# Patient Record
Sex: Male | Born: 1945 | Race: White | Hispanic: No | Marital: Married | Smoking: Never smoker
Health system: Southern US, Community
[De-identification: ages and names within clinical notes are randomized; demographics above are authoritative.]

## PROBLEM LIST (undated history)

## (undated) DIAGNOSIS — L570 Actinic keratosis: Secondary | ICD-10-CM

## (undated) DIAGNOSIS — C801 Malignant (primary) neoplasm, unspecified: Secondary | ICD-10-CM

## (undated) DIAGNOSIS — R7303 Prediabetes: Secondary | ICD-10-CM

## (undated) DIAGNOSIS — K219 Gastro-esophageal reflux disease without esophagitis: Secondary | ICD-10-CM

## (undated) DIAGNOSIS — I10 Essential (primary) hypertension: Secondary | ICD-10-CM

## (undated) DIAGNOSIS — I209 Angina pectoris, unspecified: Secondary | ICD-10-CM

## (undated) DIAGNOSIS — Z8489 Family history of other specified conditions: Secondary | ICD-10-CM

## (undated) HISTORY — DX: Actinic keratosis: L57.0

## (undated) HISTORY — PX: COLONOSCOPY: SHX174

## (undated) HISTORY — PX: OTHER SURGICAL HISTORY: SHX169

## (undated) HISTORY — PX: BACK SURGERY: SHX140

---

## 2005-03-28 ENCOUNTER — Ambulatory Visit: Payer: Self-pay | Admitting: Family Medicine

## 2005-06-10 ENCOUNTER — Ambulatory Visit: Payer: Self-pay | Admitting: Gastroenterology

## 2005-07-03 ENCOUNTER — Ambulatory Visit: Payer: Self-pay | Admitting: Gastroenterology

## 2008-01-05 ENCOUNTER — Ambulatory Visit: Payer: Self-pay | Admitting: Family Medicine

## 2008-01-28 ENCOUNTER — Ambulatory Visit: Payer: Self-pay | Admitting: Internal Medicine

## 2008-11-24 ENCOUNTER — Ambulatory Visit: Payer: Self-pay | Admitting: Gastroenterology

## 2009-02-08 ENCOUNTER — Ambulatory Visit: Payer: Self-pay | Admitting: Ophthalmology

## 2012-11-03 ENCOUNTER — Ambulatory Visit: Payer: Self-pay | Admitting: Gastroenterology

## 2014-11-02 DIAGNOSIS — I10 Essential (primary) hypertension: Secondary | ICD-10-CM | POA: Diagnosis present

## 2015-05-03 DIAGNOSIS — E119 Type 2 diabetes mellitus without complications: Secondary | ICD-10-CM

## 2015-09-20 ENCOUNTER — Encounter: Payer: Self-pay | Admitting: *Deleted

## 2015-09-24 NOTE — H&P (Signed)
See scanned H&P

## 2015-09-25 ENCOUNTER — Encounter
Admission: RE | Disposition: A | Discharge status: Home / Self Care | Payer: Self-pay | Source: Ambulatory Visit | Attending: Ophthalmology

## 2015-09-25 ENCOUNTER — Ambulatory Visit: Payer: Medicare Other | Admitting: Certified Registered"

## 2015-09-25 ENCOUNTER — Encounter: Payer: Self-pay | Admitting: *Deleted

## 2015-09-25 ENCOUNTER — Ambulatory Visit
Admission: RE | Admit: 2015-09-25 | Discharge: 2015-09-25 | Disposition: A | Discharge status: Home / Self Care | Payer: Medicare Other | Source: Ambulatory Visit | Attending: Ophthalmology | Admitting: Ophthalmology

## 2015-09-25 DIAGNOSIS — H2512 Age-related nuclear cataract, left eye: Secondary | ICD-10-CM | POA: Diagnosis not present

## 2015-09-25 DIAGNOSIS — E7800 Pure hypercholesterolemia, unspecified: Secondary | ICD-10-CM | POA: Insufficient documentation

## 2015-09-25 DIAGNOSIS — Z7982 Long term (current) use of aspirin: Secondary | ICD-10-CM | POA: Diagnosis not present

## 2015-09-25 DIAGNOSIS — Z85820 Personal history of malignant melanoma of skin: Secondary | ICD-10-CM | POA: Diagnosis not present

## 2015-09-25 DIAGNOSIS — I10 Essential (primary) hypertension: Secondary | ICD-10-CM | POA: Diagnosis not present

## 2015-09-25 DIAGNOSIS — Z79899 Other long term (current) drug therapy: Secondary | ICD-10-CM | POA: Diagnosis not present

## 2015-09-25 HISTORY — PX: CATARACT EXTRACTION W/PHACO: SHX586

## 2015-09-25 HISTORY — DX: Malignant (primary) neoplasm, unspecified: C80.1

## 2015-09-25 HISTORY — DX: Essential (primary) hypertension: I10

## 2015-09-25 SURGERY — PHACOEMULSIFICATION, CATARACT, WITH IOL INSERTION
Anesthesia: Monitor Anesthesia Care | Site: Eye | Laterality: Left | Wound class: Clean

## 2015-09-25 MED ORDER — ALFENTANIL 500 MCG/ML IJ INJ
INJECTION | INTRAMUSCULAR | Status: DC | PRN
  Administered 2015-09-25: 500 ug via INTRAVENOUS

## 2015-09-25 MED ORDER — MIDAZOLAM HCL 2 MG/2ML IJ SOLN
INTRAMUSCULAR | Status: DC | PRN
  Administered 2015-09-25: 1 mg via INTRAVENOUS

## 2015-09-25 MED ORDER — EPINEPHRINE HCL 1 MG/ML IJ SOLN
INTRAMUSCULAR | Status: AC
  Filled 2015-09-25: qty 1

## 2015-09-25 MED ORDER — HYALURONIDASE HUMAN 150 UNIT/ML IJ SOLN
INTRAMUSCULAR | Status: AC
  Filled 2015-09-25: qty 1

## 2015-09-25 MED ORDER — EPINEPHRINE HCL 1 MG/ML IJ SOLN
INTRAOCULAR | Status: DC | PRN
  Administered 2015-09-25: 1 mL via OPHTHALMIC

## 2015-09-25 MED ORDER — TETRACAINE HCL 0.5 % OP SOLN
OPHTHALMIC | Status: AC
  Filled 2015-09-25: qty 2

## 2015-09-25 MED ORDER — NA CHONDROIT SULF-NA HYALURON 40-17 MG/ML IO SOLN
INTRAOCULAR | Status: DC | PRN
  Administered 2015-09-25: 1 mL via INTRAOCULAR

## 2015-09-25 MED ORDER — CYCLOPENTOLATE HCL 2 % OP SOLN
OPHTHALMIC | Status: AC
  Administered 2015-09-25: 1 [drp] via OPHTHALMIC
  Filled 2015-09-25: qty 2

## 2015-09-25 MED ORDER — CYCLOPENTOLATE HCL 2 % OP SOLN
1.0000 [drp] | OPHTHALMIC | Status: AC | PRN
  Administered 2015-09-25 (×4): 1 [drp] via OPHTHALMIC

## 2015-09-25 MED ORDER — LIDOCAINE HCL (PF) 4 % IJ SOLN
INTRAMUSCULAR | Status: DC | PRN
  Administered 2015-09-25: 4 mL via OPHTHALMIC

## 2015-09-25 MED ORDER — LIDOCAINE HCL (PF) 4 % IJ SOLN
INTRAMUSCULAR | Status: AC
  Filled 2015-09-25: qty 10

## 2015-09-25 MED ORDER — BUPIVACAINE HCL (PF) 0.75 % IJ SOLN
INTRAMUSCULAR | Status: AC
  Filled 2015-09-25: qty 10

## 2015-09-25 MED ORDER — TETRACAINE HCL 0.5 % OP SOLN
OPHTHALMIC | Status: DC | PRN
  Administered 2015-09-25: 1 [drp] via OPHTHALMIC

## 2015-09-25 MED ORDER — PHENYLEPHRINE HCL 10 % OP SOLN
OPHTHALMIC | Status: AC
  Administered 2015-09-25: 1 [drp] via OPHTHALMIC
  Filled 2015-09-25: qty 5

## 2015-09-25 MED ORDER — CARBACHOL 0.01 % IO SOLN
INTRAOCULAR | Status: DC | PRN
  Administered 2015-09-25: .5 mL via INTRAOCULAR

## 2015-09-25 MED ORDER — MOXIFLOXACIN HCL 0.5 % OP SOLN
OPHTHALMIC | Status: DC | PRN
  Administered 2015-09-25: 1 [drp] via OPHTHALMIC

## 2015-09-25 MED ORDER — CEFUROXIME OPHTHALMIC INJECTION 1 MG/0.1 ML
INJECTION | OPHTHALMIC | Status: DC | PRN
  Administered 2015-09-25: .1 mL via INTRACAMERAL

## 2015-09-25 MED ORDER — MOXIFLOXACIN HCL 0.5 % OP SOLN
OPHTHALMIC | Status: AC
  Administered 2015-09-25: 1 [drp] via OPHTHALMIC
  Filled 2015-09-25: qty 3

## 2015-09-25 MED ORDER — LIDOCAINE HCL (PF) 4 % IJ SOLN
INTRAMUSCULAR | Status: DC | PRN
  Administered 2015-09-25: .5 mL via OPHTHALMIC

## 2015-09-25 MED ORDER — NA CHONDROIT SULF-NA HYALURON 40-17 MG/ML IO SOLN
INTRAOCULAR | Status: AC
  Filled 2015-09-25: qty 1

## 2015-09-25 MED ORDER — PHENYLEPHRINE HCL 10 % OP SOLN
1.0000 [drp] | OPHTHALMIC | Status: AC | PRN
  Administered 2015-09-25 (×4): 1 [drp] via OPHTHALMIC

## 2015-09-25 MED ORDER — MOXIFLOXACIN HCL 0.5 % OP SOLN
1.0000 [drp] | OPHTHALMIC | Status: AC | PRN
  Administered 2015-09-25 (×3): 1 [drp] via OPHTHALMIC

## 2015-09-25 MED ORDER — CEFUROXIME OPHTHALMIC INJECTION 1 MG/0.1 ML
INJECTION | OPHTHALMIC | Status: AC
  Filled 2015-09-25: qty 0.1

## 2015-09-25 MED ORDER — SODIUM CHLORIDE 0.9 % IV SOLN
INTRAVENOUS | Status: DC
  Administered 2015-09-25: 09:00:00 via INTRAVENOUS

## 2015-09-25 SURGICAL SUPPLY — 30 items
CANNULA ANT/CHMB 27GA (MISCELLANEOUS) ×2 IMPLANT
CORD BIP STRL DISP 12FT (MISCELLANEOUS) ×2 IMPLANT
CUP MEDICINE 2OZ PLAST GRAD ST (MISCELLANEOUS) ×2 IMPLANT
DRAPE XRAY CASSETTE 23X24 (DRAPES) ×2 IMPLANT
ERASER HMR WETFIELD 18G (MISCELLANEOUS) ×2 IMPLANT
GLOVE BIO SURGEON STRL SZ8 (GLOVE) ×2 IMPLANT
GLOVE SURG LX 6.5 MICRO (GLOVE) ×1
GLOVE SURG LX 8.0 MICRO (GLOVE) ×1
GLOVE SURG LX STRL 6.5 MICRO (GLOVE) ×1 IMPLANT
GLOVE SURG LX STRL 8.0 MICRO (GLOVE) ×1 IMPLANT
GOWN STRL REUS W/ TWL LRG LVL3 (GOWN DISPOSABLE) ×1 IMPLANT
GOWN STRL REUS W/ TWL XL LVL3 (GOWN DISPOSABLE) ×1 IMPLANT
GOWN STRL REUS W/TWL LRG LVL3 (GOWN DISPOSABLE) ×1
GOWN STRL REUS W/TWL XL LVL3 (GOWN DISPOSABLE) ×1
LENS IOL ACRSF IQ ULTRA 17.5 (Intraocular Lens) ×1 IMPLANT
LENS IOL ACRYSOF IQ 17.5 (Intraocular Lens) ×2 IMPLANT
PACK CATARACT (MISCELLANEOUS) ×2 IMPLANT
PACK CATARACT DINGLEDEIN LX (MISCELLANEOUS) ×2 IMPLANT
PACK EYE AFTER SURG (MISCELLANEOUS) ×2 IMPLANT
SHLD EYE VISITEC  UNIV (MISCELLANEOUS) ×2 IMPLANT
SOL BSS BAG (MISCELLANEOUS) ×2
SOL PREP PVP 2OZ (MISCELLANEOUS) ×2
SOLUTION BSS BAG (MISCELLANEOUS) ×1 IMPLANT
SOLUTION PREP PVP 2OZ (MISCELLANEOUS) ×1 IMPLANT
SUT SILK 5-0 (SUTURE) ×2 IMPLANT
SYR 3ML LL SCALE MARK (SYRINGE) ×2 IMPLANT
SYR 5ML LL (SYRINGE) ×2 IMPLANT
SYR TB 1ML 27GX1/2 LL (SYRINGE) ×2 IMPLANT
WATER STERILE IRR 1000ML POUR (IV SOLUTION) ×2 IMPLANT
WIPE NON LINTING 3.25X3.25 (MISCELLANEOUS) ×2 IMPLANT

## 2015-09-25 NOTE — Discharge Instructions (Addendum)
See hand out.

## 2015-09-25 NOTE — Anesthesia Preprocedure Evaluation (Signed)
Anesthesia Evaluation  Patient identified by MRN, date of birth, ID band Patient awake    Reviewed: Allergy & Precautions, H&P , NPO status , Patient's Chart, lab work & pertinent test results, reviewed documented beta blocker date and time   History of Anesthesia Complications Negative for: history of anesthetic complications  Airway Mallampati: III  TM Distance: >3 FB Neck ROM: full    Dental no notable dental hx. (+) Caps, Teeth Intact   Pulmonary neg pulmonary ROS,    Pulmonary exam normal breath sounds clear to auscultation       Cardiovascular Exercise Tolerance: Good hypertension, (-) angina(-) CAD, (-) Past MI, (-) Cardiac Stents and (-) CABG Normal cardiovascular exam(-) dysrhythmias (-) Valvular Problems/Murmurs Rhythm:regular Rate:Normal     Neuro/Psych negative neurological ROS  negative psych ROS   GI/Hepatic negative GI ROS, Neg liver ROS,   Endo/Other  negative endocrine ROS  Renal/GU negative Renal ROS  negative genitourinary   Musculoskeletal   Abdominal   Peds  Hematology negative hematology ROS (+)   Anesthesia Other Findings Past Medical History:   Hypertension                                                 Cancer ([place])                                                   Comment:melanoma   Reproductive/Obstetrics negative OB ROS                             Anesthesia Physical Anesthesia Plan  ASA: II  Anesthesia Plan: MAC   Post-op Pain Management:    Induction:   Airway Management Planned:   Additional Equipment:   Intra-op Plan:   Post-operative Plan:   Informed Consent: I have reviewed the patients History and Physical, chart, labs and discussed the procedure including the risks, benefits and alternatives for the proposed anesthesia with the patient or authorized representative who has indicated his/her understanding and acceptance.   Dental Advisory  Given  Plan Discussed with: Anesthesiologist, CRNA and Surgeon  Anesthesia Plan Comments:         Anesthesia Quick Evaluation

## 2015-09-25 NOTE — Op Note (Signed)
Date of Surgery: 09/25/2015 Date of Dictation: 09/25/2015 9:57 AM Pre-operative Diagnosis:  Nuclear Sclerotic Cataract left Eye Post-operative Diagnosis: same Procedure performed: Extra-capsular Cataract Extraction (ECCE) with placement of a posterior chamber intraocular lens (IOL) left Eye IOL:  Implant Name Type Inv. Item Serial No. Manufacturer Lot No. LRB No. Used  LENS IOL ACRYSOF IQ 17.5 - [id number] Intraocular Lens LENS IOL ACRYSOF IQ 17.5 [id number] ALCON   Left 1   Anesthesia: 2% Lidocaine and 4% Marcaine in a 50/50 mixture with 10 unites/ml of Hylenex given as a peribulbar Anesthesiologist: Anesthesiologist: Martha Clan, MD CRNA: Rolla Plate, CRNA Complications: none Estimated Blood Loss: less than 1 ml  Description of procedure:  The patient was given anesthesia and sedation via intravenous access. The patient was then prepped and draped in the usual fashion. A 25-gauge needle was bent for initiating the capsulorhexis. A 5-0 silk suture was placed through the conjunctiva superior and inferiorly to serve as bridle sutures. Hemostasis was obtained at the superior limbus using an eraser cautery. A partial thickness groove was made at the anterior surgical limbus with a 64 Beaver blade and this was dissected anteriorly with an [employer]. The anterior chamber was entered at 10 o'clock with a 1.0 mm paracentesis knife and through the lamellar dissection with a 2.6 mm Alcon keratome. Epi-Shugarcaine 0.5 CC [9 cc BSS Plus (Alcon), 3 cc 4% preservative-free lidocaine (Hospira) and 4 cc 1:1000 preservative-free, bisulfite-free epinephrine] was injected into the anterior chamber via the paracentesis tract. Epi-Shugarcaine 0.5 CC [9 cc BSS Plus (Alcon), 3 cc 4% preservative-free lidocaine (Hospira) and 4 cc 1:1000 preservative-free, bisulfite-free epinephrine] was injected into the anterior chamber via the paracentesis tract. DiscoVisc was injected to replace the aqueous and a  continuous tear curvilinear capsulorhexis was performed using a bent 25-gauge needle.  Balance salt on a syringe was used to perform hydro-dissection and phacoemulsification was carried out using a divide and conquer technique. Procedure(s) with comments: CATARACT EXTRACTION PHACO AND INTRAOCULAR LENS PLACEMENT (IOC) (Left) - [place] 01:28 AP% 25.5 CDE 37.96 fluid pack lot [id number] [facility]. Irrigation/aspiration was used to remove the residual cortex and the capsular bag was inflated with DiscoVisc. The intraocular lens was inserted into the capsular bag using a pre-loaded UltraSert Delivery System. Irrigation/aspiration was used to remove the residual DiscoVisc. The wound was inflated with balanced salt and checked for leaks. None were found. Miostat was injected via the paracentesis track and 0.1 ml of cefuroxime containing 1 mg of drug  was injected via the paracentesis track. The wound was checked for leaks again and none were found.   The bridal sutures were removed and two drops of Vigamox were placed on the eye. An eye shield was placed to protect the eye and the patient was discharged to the recovery area in good condition.   Chelsy Parrales MD

## 2015-09-25 NOTE — Interval H&P Note (Signed)
History and Physical Interval Note:  09/25/2015 8:14 AM  Derek Dawson  has presented today for surgery, with the diagnosis of CATARACT  The various methods of treatment have been discussed with the patient and family. After consideration of risks, benefits and other options for treatment, the patient has consented to  Procedure(s): CATARACT EXTRACTION PHACO AND INTRAOCULAR LENS PLACEMENT ([place]) (Left) as a surgical intervention .  The patient's history has been reviewed, patient examined, no change in status, stable for surgery.  I have reviewed the patient's chart and labs.  Questions were answered to the patient's satisfaction.     Derek Dawson

## 2015-09-25 NOTE — Anesthesia Postprocedure Evaluation (Signed)
Anesthesia Post Note  Patient: Derek Dawson  Procedure(s) Performed: Procedure(s) (LRB): CATARACT EXTRACTION PHACO AND INTRAOCULAR LENS PLACEMENT ([place]) (Left)  Patient location during evaluation: Short Stay Anesthesia Type: MAC Level of consciousness: awake and alert Pain management: satisfactory to patient Vital Signs Assessment: post-procedure vital signs reviewed and stable Respiratory status: spontaneous breathing Cardiovascular status: stable Postop Assessment: no headache Anesthetic complications: no    Last Vitals:  Filed Vitals:   09/25/15 0819 09/25/15 0959  BP: 134/55 123/56  Pulse: 61 70  Temp: 36.8 C 36.7 C  Resp: 16 14    Last Pain: There were no vitals filed for this visit.               Rolla Plate P

## 2015-09-25 NOTE — Transfer of Care (Signed)
Immediate Anesthesia Transfer of Care Note  Patient: AZURE MIRANTE  Procedure(s) Performed: Procedure(s) with comments: CATARACT EXTRACTION PHACO AND INTRAOCULAR LENS PLACEMENT ([place]) (Left) - [place] 01:28 AP% 25.5 CDE 37.96 fluid pack lot [id number] [facility]  Patient Location: Short Stay  Anesthesia Type:MAC  Level of Consciousness: awake and alert   Airway & Oxygen Therapy: Patient Spontanous Breathing  Post-op Assessment: Report given to RN  Post vital signs: Reviewed  Last Vitals:  Filed Vitals:   09/25/15 0819 09/25/15 0959  BP: 134/55 123/56  Pulse: 61 71  Temp: 36.8 C 36.7 C  Resp: 16 14    Complications: No apparent anesthesia complications

## 2015-12-01 DIAGNOSIS — M7502 Adhesive capsulitis of left shoulder: Secondary | ICD-10-CM | POA: Insufficient documentation

## 2015-12-01 DIAGNOSIS — M7522 Bicipital tendinitis, left shoulder: Secondary | ICD-10-CM | POA: Insufficient documentation

## 2016-06-12 ENCOUNTER — Encounter: Payer: Self-pay | Admitting: *Deleted

## 2016-06-13 ENCOUNTER — Encounter
Admission: RE | Disposition: A | Discharge status: Home / Self Care | Payer: Self-pay | Source: Ambulatory Visit | Attending: Gastroenterology

## 2016-06-13 ENCOUNTER — Ambulatory Visit: Payer: Medicare Other | Admitting: Anesthesiology

## 2016-06-13 ENCOUNTER — Ambulatory Visit
Admission: RE | Admit: 2016-06-13 | Discharge: 2016-06-13 | Disposition: A | Discharge status: Home / Self Care | Payer: Medicare Other | Source: Ambulatory Visit | Attending: Gastroenterology | Admitting: Gastroenterology

## 2016-06-13 ENCOUNTER — Encounter: Payer: Self-pay | Admitting: *Deleted

## 2016-06-13 DIAGNOSIS — I10 Essential (primary) hypertension: Secondary | ICD-10-CM | POA: Insufficient documentation

## 2016-06-13 DIAGNOSIS — Z7982 Long term (current) use of aspirin: Secondary | ICD-10-CM | POA: Insufficient documentation

## 2016-06-13 DIAGNOSIS — Z5309 Procedure and treatment not carried out because of other contraindication: Secondary | ICD-10-CM | POA: Diagnosis not present

## 2016-06-13 DIAGNOSIS — Z85820 Personal history of malignant melanoma of skin: Secondary | ICD-10-CM | POA: Insufficient documentation

## 2016-06-13 DIAGNOSIS — Z800 Family history of malignant neoplasm of digestive organs: Secondary | ICD-10-CM | POA: Diagnosis not present

## 2016-06-13 DIAGNOSIS — Z86010 Personal history of colonic polyps: Secondary | ICD-10-CM | POA: Insufficient documentation

## 2016-06-13 HISTORY — PX: COLONOSCOPY WITH PROPOFOL: SHX5780

## 2016-06-13 SURGERY — COLONOSCOPY WITH PROPOFOL
Anesthesia: General

## 2016-06-13 MED ORDER — PROPOFOL 10 MG/ML IV BOLUS
INTRAVENOUS | Status: DC | PRN
  Administered 2016-06-13: 100 mg via INTRAVENOUS

## 2016-06-13 MED ORDER — SODIUM CHLORIDE 0.9 % IV SOLN: INTRAVENOUS | Status: DC

## 2016-06-13 MED ORDER — LIDOCAINE 2% (20 MG/ML) 5 ML SYRINGE
INTRAMUSCULAR | Status: DC | PRN
  Administered 2016-06-13: 40 mg via INTRAVENOUS

## 2016-06-13 MED ORDER — SODIUM CHLORIDE 0.9 % IV SOLN
INTRAVENOUS | Status: DC
  Administered 2016-06-13: 08:00:00 via INTRAVENOUS

## 2016-06-13 MED ORDER — PROPOFOL 500 MG/50ML IV EMUL
INTRAVENOUS | Status: DC | PRN
  Administered 2016-06-13: 160 ug/kg/min via INTRAVENOUS

## 2016-06-13 MED ORDER — MIDAZOLAM HCL 5 MG/5ML IJ SOLN
INTRAMUSCULAR | Status: DC | PRN
  Administered 2016-06-13: 1 mg via INTRAVENOUS

## 2016-06-13 MED ORDER — PHENYLEPHRINE HCL 10 MG/ML IJ SOLN
INTRAMUSCULAR | Status: DC | PRN
  Administered 2016-06-13: 100 ug via INTRAVENOUS

## 2016-06-13 MED ORDER — FENTANYL CITRATE (PF) 100 MCG/2ML IJ SOLN
INTRAMUSCULAR | Status: DC | PRN
  Administered 2016-06-13: 50 ug via INTRAVENOUS

## 2016-06-13 NOTE — Transfer of Care (Signed)
Immediate Anesthesia Transfer of Care Note  Patient: Derek Dawson  Procedure(s) Performed: Procedure(s): COLONOSCOPY WITH PROPOFOL (N/A)  Patient Location: PACU and Endoscopy Unit  Anesthesia Type:General  Level of Consciousness: sedated  Airway & Oxygen Therapy: Patient Spontanous Breathing and Patient connected to nasal cannula oxygen  Post-op Assessment: Report given to RN and Post -op Vital signs reviewed and stable  Post vital signs: Reviewed and stable  Last Vitals:  Vitals:   06/13/16 0737  BP: (!) 145/56  Pulse: (!) 54  Resp: 14  Temp: 36.1 C    Last Pain:  Vitals:   06/13/16 0737  TempSrc: Tympanic         Complications: No apparent anesthesia complications

## 2016-06-13 NOTE — H&P (Signed)
Outpatient short stay form Pre-procedure 06/13/2016 8:18 AM Derek Sails MD  Primary Physician: Dr. Juluis Pitch  Reason for visit:  Colonoscopy  History of present illness:  Patient is a 70 year old male presenting today as above. Has a personal history of adenomatous colon polyps with his last procedure being done about 3-4 years ago. Also family history of colon cancer in primary relative. He tolerated his prep well. He does take a daily aspirin but has held that for several days. He takes no other aspirin or blood thinning agent.    Current Facility-Administered Medications:  .  0.9 %  sodium chloride infusion, , Intravenous, Continuous, Derek Sails, MD, Last Rate: 20 mL/hr at 06/13/16 0756 .  0.9 %  sodium chloride infusion, , Intravenous, Continuous, Derek Sails, MD  Prescriptions Prior to Admission  Medication Sig Dispense Refill Last Dose  . amLODipine (NORVASC) 5 MG tablet Take 5 mg by mouth daily.   Past Week at Unknown time  . aspirin 81 MG tablet Take 81 mg by mouth daily.   Past Week at Unknown time  . cyclobenzaprine (FLEXERIL) 10 MG tablet Take 10 mg by mouth daily.   Past Week at Unknown time  . lisinopril (PRINIVIL,ZESTRIL) 40 MG tablet Take 40 mg by mouth daily.   Past Week at Unknown time  . loratadine (CLARITIN) 10 MG tablet Take 10 mg by mouth daily.   Past Week at Unknown time  . lovastatin (MEVACOR) 40 MG tablet Take 40 mg by mouth at bedtime.   Past Week at Unknown time     No Known Allergies   Past Medical History:  Diagnosis Date  . Cancer ([place])    melanoma  . Hypertension     Review of systems:      Physical Exam    Heart and lungs: Regular rate and rhythm without rub or gallop, lungs are bilaterally clear.    HEENT: Norm cephalic atraumatic eyes are anicteric    Other:     Pertinant exam for procedure: Soft nontender nondistended bowel sounds positive normoactive.    Planned proceedures: Colonoscopy and indicated  procedures. I have discussed the risks benefits and complications of procedures to include not limited to bleeding, infection, perforation and the risk of sedation and the patient wishes to proceed.    Derek Sails, MD Gastroenterology 06/13/2016  8:18 AM

## 2016-06-13 NOTE — Anesthesia Preprocedure Evaluation (Signed)
Anesthesia Evaluation  Patient identified by MRN, date of birth, ID band Patient awake    Reviewed: Allergy & Precautions, H&P , NPO status , Patient's Chart, lab work & pertinent test results, reviewed documented beta blocker date and time   History of Anesthesia Complications Negative for: history of anesthetic complications  Airway Mallampati: III  TM Distance: >3 FB Neck ROM: full    Dental no notable dental hx. (+) Caps, Teeth Intact   Pulmonary neg pulmonary ROS,    Pulmonary exam normal breath sounds clear to auscultation       Cardiovascular Exercise Tolerance: Good hypertension, (-) angina(-) CAD, (-) Past MI, (-) Cardiac Stents and (-) CABG Normal cardiovascular exam(-) dysrhythmias (-) Valvular Problems/Murmurs Rhythm:regular Rate:Normal     Neuro/Psych negative neurological ROS  negative psych ROS   GI/Hepatic negative GI ROS, Neg liver ROS,   Endo/Other  negative endocrine ROS  Renal/GU negative Renal ROS  negative genitourinary   Musculoskeletal   Abdominal   Peds  Hematology negative hematology ROS (+)   Anesthesia Other Findings Past Medical History:   Hypertension                                                 Cancer ([place])                                                   Comment:melanoma   Reproductive/Obstetrics negative OB ROS                             Anesthesia Physical  Anesthesia Plan  ASA: II  Anesthesia Plan: General   Post-op Pain Management:    Induction:   Airway Management Planned:   Additional Equipment:   Intra-op Plan:   Post-operative Plan:   Informed Consent: I have reviewed the patients History and Physical, chart, labs and discussed the procedure including the risks, benefits and alternatives for the proposed anesthesia with the patient or authorized representative who has indicated his/her understanding and acceptance.   Dental  Advisory Given  Plan Discussed with: Anesthesiologist, CRNA and Surgeon  Anesthesia Plan Comments:         Anesthesia Quick Evaluation

## 2016-06-13 NOTE — Op Note (Signed)
[facility] Gastroenterology Patient Name: Derek Dawson Procedure Date: 06/13/2016 8:19 AM MRN: [id number] Account #: [id number] Date of Birth: 10/18/1945 Admit Type: Outpatient Age: 70 Room: [facility] ENDO ROOM 1 Gender: Male Note Status: Finalized Procedure:            Colonoscopy Indications:          Family history of colon cancer in a first-degree                        relative, Personal history of colonic polyps Providers:            Lollie Sails, MD Referring MD:         Youlanda Roys. Lovie Macadamia, MD (Referring MD) Medicines:            Monitored Anesthesia Care Complications:        No immediate complications. Procedure:            Pre-Anesthesia Assessment:                       - ASA Grade Assessment: II - A patient with mild                        systemic disease.                       After obtaining informed consent, the colonoscope was                        passed under direct vision. Throughout the procedure,                        the patient's blood pressure, pulse, and oxygen                        saturations were monitored continuously. The                        Colonoscope was introduced through the anus with the                        intention of advancing to the cecum. The scope was                        advanced to the descending colon before the procedure                        was aborted. Medications were given. The colonoscopy                        was performed with difficulty due to poor bowel prep                        with stool present. The colonoscopy was aborted due to                        poor bowel prep with stool present. Findings:      Semi-liquid solid stool was found in the rectum and in the sigmoid       colon, interfering with visualization.      The digital rectal exam was normal.  Impression:           - The procedure was aborted due to poor bowel prep with                        stool present.       - Stool in the rectum and in the sigmoid colon.                       - No specimens collected. Recommendation:       - Discharge patient to home.                       - Put patient on a clear liquid diet starting today.                       - repeat prep and reschedule. Procedure Code(s):    --- Professional ---                       [id number], 38, Colonoscopy, flexible; diagnostic, including                        collection of specimen(s) by brushing or washing, when                        performed (separate procedure) Diagnosis Code(s):    --- Professional ---                       Z53.8, Procedure and treatment not carried out for                        other reasons                       Z80.0, Family history of malignant neoplasm of                        digestive organs                       Z86.010, Personal history of colonic polyps CPT copyright 2016 American Medical Association. All rights reserved. The codes documented in this report are preliminary and upon coder review may  be revised to meet current compliance requirements. Lollie Sails, MD 06/13/2016 8:44:09 AM This report has been signed electronically. Number of Addenda: 0 Note Initiated On: 06/13/2016 8:19 AM Total Procedure Duration: 0 hours 5 minutes 22 seconds       [facility]

## 2016-06-13 NOTE — Anesthesia Postprocedure Evaluation (Signed)
Anesthesia Post Note  Patient: Derek Dawson  Procedure(s) Performed: Procedure(s) (LRB): COLONOSCOPY WITH PROPOFOL (N/A)  Patient location during evaluation: Endoscopy Anesthesia Type: General Level of consciousness: awake and alert Pain management: pain level controlled Vital Signs Assessment: post-procedure vital signs reviewed and stable Respiratory status: spontaneous breathing, nonlabored ventilation, respiratory function stable and patient connected to nasal cannula oxygen Cardiovascular status: blood pressure returned to baseline and stable Postop Assessment: no signs of nausea or vomiting Anesthetic complications: no    Last Vitals:  Vitals:   06/13/16 0847 06/13/16 0857  BP: 100/75 119/61  Pulse: (!) 41 69  Resp: 17 11  Temp:      Last Pain:  Vitals:   06/13/16 0837  TempSrc: Tympanic                 Martha Clan

## 2016-06-14 ENCOUNTER — Encounter
Admission: RE | Disposition: A | Discharge status: Home / Self Care | Payer: Self-pay | Source: Ambulatory Visit | Attending: Gastroenterology

## 2016-06-14 ENCOUNTER — Ambulatory Visit
Admission: RE | Admit: 2016-06-14 | Discharge: 2016-06-14 | Disposition: A | Discharge status: Home / Self Care | Payer: Medicare Other | Source: Ambulatory Visit | Attending: Gastroenterology | Admitting: Gastroenterology

## 2016-06-14 ENCOUNTER — Ambulatory Visit: Payer: Medicare Other | Admitting: Anesthesiology

## 2016-06-14 ENCOUNTER — Encounter: Payer: Self-pay | Admitting: Gastroenterology

## 2016-06-14 DIAGNOSIS — Z7982 Long term (current) use of aspirin: Secondary | ICD-10-CM | POA: Diagnosis not present

## 2016-06-14 DIAGNOSIS — K640 First degree hemorrhoids: Secondary | ICD-10-CM | POA: Insufficient documentation

## 2016-06-14 DIAGNOSIS — I493 Ventricular premature depolarization: Secondary | ICD-10-CM | POA: Insufficient documentation

## 2016-06-14 DIAGNOSIS — Z8719 Personal history of other diseases of the digestive system: Secondary | ICD-10-CM | POA: Diagnosis not present

## 2016-06-14 DIAGNOSIS — Z1211 Encounter for screening for malignant neoplasm of colon: Secondary | ICD-10-CM | POA: Diagnosis present

## 2016-06-14 DIAGNOSIS — K5730 Diverticulosis of large intestine without perforation or abscess without bleeding: Secondary | ICD-10-CM | POA: Diagnosis not present

## 2016-06-14 DIAGNOSIS — I499 Cardiac arrhythmia, unspecified: Secondary | ICD-10-CM

## 2016-06-14 DIAGNOSIS — Z79899 Other long term (current) drug therapy: Secondary | ICD-10-CM | POA: Diagnosis not present

## 2016-06-14 DIAGNOSIS — I10 Essential (primary) hypertension: Secondary | ICD-10-CM | POA: Insufficient documentation

## 2016-06-14 HISTORY — PX: COLONOSCOPY WITH PROPOFOL: SHX5780

## 2016-06-14 SURGERY — COLONOSCOPY WITH PROPOFOL
Anesthesia: General

## 2016-06-14 MED ORDER — PROPOFOL 10 MG/ML IV BOLUS
INTRAVENOUS | Status: DC | PRN
  Administered 2016-06-14: 50 mg via INTRAVENOUS

## 2016-06-14 MED ORDER — LIDOCAINE HCL (CARDIAC) 20 MG/ML IV SOLN
INTRAVENOUS | Status: DC | PRN
  Administered 2016-06-14: 60 mg via INTRAVENOUS

## 2016-06-14 MED ORDER — PHENYLEPHRINE HCL 10 MG/ML IJ SOLN
INTRAMUSCULAR | Status: DC | PRN
  Administered 2016-06-14: 100 ug via INTRAVENOUS

## 2016-06-14 MED ORDER — ONDANSETRON HCL 4 MG/2ML IJ SOLN: 4.0000 mg | Freq: Once | INTRAMUSCULAR | Status: DC | PRN

## 2016-06-14 MED ORDER — FENTANYL CITRATE (PF) 100 MCG/2ML IJ SOLN: 25.0000 ug | INTRAMUSCULAR | Status: DC | PRN

## 2016-06-14 MED ORDER — SODIUM CHLORIDE 0.9 % IV SOLN: INTRAVENOUS | Status: DC

## 2016-06-14 MED ORDER — SODIUM CHLORIDE 0.9 % IV SOLN
INTRAVENOUS | Status: DC
  Administered 2016-06-14: 1000 mL via INTRAVENOUS

## 2016-06-14 MED ORDER — PROPOFOL 500 MG/50ML IV EMUL
INTRAVENOUS | Status: DC | PRN
  Administered 2016-06-14: 140 ug/kg/min via INTRAVENOUS

## 2016-06-14 NOTE — Transfer of Care (Signed)
Immediate Anesthesia Transfer of Care Note  Patient: Derek Dawson  Procedure(s) Performed: Procedure(s): COLONOSCOPY WITH PROPOFOL (N/A)  Patient Location: Endoscopy Unit  Anesthesia Type:General  Level of Consciousness: awake, alert , oriented and patient cooperative  Airway & Oxygen Therapy: Patient Spontanous Breathing and Patient connected to nasal cannula oxygen  Post-op Assessment: Report given to RN, Post -op Vital signs reviewed and stable and Patient moving all extremities X 4  Post vital signs: Reviewed and stable  Last Vitals:  Vitals:   06/14/16 1232  BP: (!) 154/54  Pulse: 64  Resp: 15  Temp: (!) 36.1 C    Last Pain:  Vitals:   06/14/16 1232  TempSrc: Tympanic         Complications: No apparent anesthesia complications

## 2016-06-14 NOTE — H&P (Signed)
Outpatient short stay form Pre-procedure 06/14/2016 1:10 PM Lollie Sails MD  Primary Physician: Dr. Juluis Pitch  Reason for visit:  Colonoscopy  History of present illness:  Patient is a 70 year old male presenting today for colonoscopy. Gastric came in yesterday however prep was not adequate and required reprep overnight. He tolerated this well. He does take a daily aspirin but has not for several days. He takes no other chronic. He was noted to have an irregular heart rate this morning 12-lead EKG being done showing PVCs and PACs. He is been hemodynamically stable. Does have an appointment from his primary physician to see Dr. Nehemiah Massed or cardiology evaluation.   Current Facility-Administered Medications:  .  0.9 %  sodium chloride infusion, , Intravenous, Continuous, Lollie Sails, MD, Last Rate: 20 mL/hr at 06/14/16 1250, 1,000 mL at 06/14/16 1250 .  0.9 %  sodium chloride infusion, , Intravenous, Continuous, Lollie Sails, MD .  fentaNYL (SUBLIMAZE) injection 25 mcg, 25 mcg, Intravenous, Q5 min PRN, Alvin Critchley, MD .  ondansetron [facility]) injection 4 mg, 4 mg, Intravenous, Once PRN, Alvin Critchley, MD  Prescriptions Prior to Admission  Medication Sig Dispense Refill Last Dose  . amLODipine (NORVASC) 5 MG tablet Take 5 mg by mouth daily.   Past Week at Unknown time  . aspirin 81 MG tablet Take 81 mg by mouth daily.   Past Week at Unknown time  . cyclobenzaprine (FLEXERIL) 10 MG tablet Take 10 mg by mouth daily.   Past Week at Unknown time  . lisinopril (PRINIVIL,ZESTRIL) 40 MG tablet Take 40 mg by mouth daily.   Past Week at Unknown time  . loratadine (CLARITIN) 10 MG tablet Take 10 mg by mouth daily.   Past Week at Unknown time  . lovastatin (MEVACOR) 40 MG tablet Take 40 mg by mouth at bedtime.   Past Week at Unknown time     No Known Allergies   Past Medical History:  Diagnosis Date  . Cancer ([place])    melanoma  . Hypertension     Review of systems:       Physical Exam    Heart and lungs: Regular rate and rhythm without rub or gallop, lungs are bilaterally clear.    HEENT: Normocephalic atraumatic eyes are anicteric    Other:     Pertinant exam for procedure: Soft nontender nondistended bowel sounds positive normoactive.    Planned proceedures: Colonoscopy and indicated procedures. I have discussed the risks benefits and complications of procedures to include not limited to bleeding, infection, perforation and the risk of sedation and the patient wishes to proceed.    Lollie Sails, MD Gastroenterology 06/14/2016  1:10 PM

## 2016-06-14 NOTE — Op Note (Signed)
[facility] Gastroenterology Patient Name: Derek Dawson Procedure Date: 06/14/2016 1:11 PM MRN: [id number] Account #: [id number] Date of Birth: [birth year] Admit Type: Outpatient Age: 70 Room: [facility] ENDO ROOM 4 Gender: Male Note Status: Finalized Procedure:            Colonoscopy Indications:          Personal history of colonic polyps Providers:            Lollie Sails, MD Referring MD:         Youlanda Roys. Lovie Macadamia, MD (Referring MD) Medicines:            Monitored Anesthesia Care Complications:        No immediate complications. Procedure:            Pre-Anesthesia Assessment:                       - ASA Grade Assessment: III - A patient with severe                        systemic disease.                       After obtaining informed consent, the colonoscope was                        passed under direct vision. Throughout the procedure,                        the patient's blood pressure, pulse, and oxygen                        saturations were monitored continuously. The                        Colonoscope was introduced through the anus and                        advanced to the the cecum, identified by appendiceal                        orifice and ileocecal valve. The colonoscopy was                        performed without difficulty. The patient tolerated the                        procedure well. The quality of the bowel preparation                        was good. Findings:      Multiple small and large-mouthed diverticula were found in the sigmoid       colon, descending colon, transverse colon and ascending colon.      Non-bleeding internal hemorrhoids were found during retroflexion and       during anoscopy. The hemorrhoids were small and Grade I (internal       hemorrhoids that do not prolapse).      No additional abnormalities were found on retroflexion. Impression:           - Diverticulosis in the sigmoid colon, in the        descending colon, in  the transverse colon and in the                        ascending colon.                       - Non-bleeding internal hemorrhoids.                       - No specimens collected. Recommendation:       - Discharge patient to home.                       - Repeat colonoscopy in 5 years for screening purposes. Procedure Code(s):    --- Professional ---                       [id number], Colonoscopy, flexible; diagnostic, including                        collection of specimen(s) by brushing or washing, when                        performed (separate procedure) Diagnosis Code(s):    --- Professional ---                       K64.0, First degree hemorrhoids                       Z86.010, Personal history of colonic polyps                       K57.30, Diverticulosis of large intestine without                        perforation or abscess without bleeding CPT copyright 2016 American Medical Association. All rights reserved. The codes documented in this report are preliminary and upon coder review may  be revised to meet current compliance requirements. Lollie Sails, MD 06/14/2016 1:42:49 PM This report has been signed electronically. Number of Addenda: 0 Note Initiated On: 06/14/2016 1:11 PM Scope Withdrawal Time: 0 hours 7 minutes 37 seconds  Total Procedure Duration: 0 hours 20 minutes 22 seconds       [facility]

## 2016-06-14 NOTE — Anesthesia Postprocedure Evaluation (Signed)
Anesthesia Post Note  Patient: Derek Dawson  Procedure(s) Performed: Procedure(s) (LRB): COLONOSCOPY WITH PROPOFOL (N/A)  Patient location during evaluation: PACU Anesthesia Type: General Level of consciousness: awake and alert and oriented Pain management: pain level controlled Vital Signs Assessment: post-procedure vital signs reviewed and stable Respiratory status: spontaneous breathing Cardiovascular status: blood pressure returned to baseline Anesthetic complications: no    Last Vitals:  Vitals:   06/14/16 1405 06/14/16 1415  BP: 133/67 140/80  Pulse: 78 70  Resp: (!) 24 19  Temp:      Last Pain:  Vitals:   06/14/16 1345  TempSrc: Tympanic                 Lelia Jons

## 2016-06-14 NOTE — Anesthesia Preprocedure Evaluation (Signed)
Anesthesia Evaluation  Patient identified by MRN, date of birth, ID band Patient awake    Reviewed: Allergy & Precautions, H&P , NPO status , Patient's Chart, lab work & pertinent test results, reviewed documented beta blocker date and time   History of Anesthesia Complications Negative for: history of anesthetic complications  Airway Mallampati: III  TM Distance: >3 FB Neck ROM: full    Dental no notable dental hx. (+) Caps, Teeth Intact   Pulmonary neg pulmonary ROS,    Pulmonary exam normal breath sounds clear to auscultation       Cardiovascular Exercise Tolerance: Good hypertension, Pt. on medications (-) angina(-) CAD, (-) Past MI, (-) Cardiac Stents and (-) CABG Normal cardiovascular exam(-) dysrhythmias (-) Valvular Problems/Murmurs Rhythm:regular Rate:Normal     Neuro/Psych negative neurological ROS  negative psych ROS   GI/Hepatic negative GI ROS, Neg liver ROS,   Endo/Other  negative endocrine ROS  Renal/GU negative Renal ROS  negative genitourinary   Musculoskeletal negative musculoskeletal ROS (+)   Abdominal   Peds negative pediatric ROS (+)  Hematology negative hematology ROS (+)   Anesthesia Other Findings melanoma  Reproductive/Obstetrics negative OB ROS                             Anesthesia Physical  Anesthesia Plan  ASA: II  Anesthesia Plan: General   Post-op Pain Management:    Induction:   Airway Management Planned:   Additional Equipment:   Intra-op Plan:   Post-operative Plan:   Informed Consent: I have reviewed the patients History and Physical, chart, labs and discussed the procedure including the risks, benefits and alternatives for the proposed anesthesia with the patient or authorized representative who has indicated his/her understanding and acceptance.   Dental Advisory Given  Plan Discussed with: Anesthesiologist, CRNA and  Surgeon  Anesthesia Plan Comments:         Anesthesia Quick Evaluation

## 2016-06-17 ENCOUNTER — Encounter: Payer: Self-pay | Admitting: Gastroenterology

## 2016-07-22 DIAGNOSIS — I493 Ventricular premature depolarization: Secondary | ICD-10-CM | POA: Insufficient documentation

## 2016-07-22 DIAGNOSIS — E782 Mixed hyperlipidemia: Secondary | ICD-10-CM | POA: Diagnosis present

## 2016-12-30 DIAGNOSIS — D239 Other benign neoplasm of skin, unspecified: Secondary | ICD-10-CM

## 2016-12-30 HISTORY — DX: Other benign neoplasm of skin, unspecified: D23.9

## 2017-12-31 DIAGNOSIS — C439 Malignant melanoma of skin, unspecified: Secondary | ICD-10-CM

## 2017-12-31 HISTORY — DX: Malignant melanoma of skin, unspecified: C43.9

## 2018-06-04 ENCOUNTER — Emergency Department
Admission: EM | Admit: 2018-06-04 | Discharge: 2018-06-04 | Disposition: A | Discharge status: Home / Self Care | Payer: Medicare Other | Attending: Emergency Medicine | Admitting: Emergency Medicine

## 2018-06-04 ENCOUNTER — Other Ambulatory Visit: Payer: Self-pay

## 2018-06-04 ENCOUNTER — Encounter: Payer: Self-pay | Admitting: Emergency Medicine

## 2018-06-04 ENCOUNTER — Emergency Department: Payer: Medicare Other

## 2018-06-04 DIAGNOSIS — Z79899 Other long term (current) drug therapy: Secondary | ICD-10-CM | POA: Diagnosis not present

## 2018-06-04 DIAGNOSIS — R1010 Upper abdominal pain, unspecified: Secondary | ICD-10-CM | POA: Diagnosis not present

## 2018-06-04 DIAGNOSIS — Z7982 Long term (current) use of aspirin: Secondary | ICD-10-CM | POA: Insufficient documentation

## 2018-06-04 DIAGNOSIS — I10 Essential (primary) hypertension: Secondary | ICD-10-CM | POA: Insufficient documentation

## 2018-06-04 LAB — URINALYSIS, COMPLETE (UACMP) WITH MICROSCOPIC
Bacteria, UA: NONE SEEN
Bilirubin Urine: NEGATIVE
Glucose, UA: NEGATIVE mg/dL
HGB URINE DIPSTICK: NEGATIVE
Ketones, ur: 5 mg/dL — AB
Leukocytes, UA: NEGATIVE
NITRITE: NEGATIVE
PROTEIN: NEGATIVE mg/dL
SPECIFIC GRAVITY, URINE: 1.019 (ref 1.005–1.030)
pH: 5 (ref 5.0–8.0)

## 2018-06-04 LAB — TROPONIN I

## 2018-06-04 LAB — LIPASE, BLOOD: Lipase: 34 U/L (ref 11–51)

## 2018-06-04 LAB — COMPREHENSIVE METABOLIC PANEL
ALK PHOS: 47 U/L (ref 38–126)
ALT: 21 U/L (ref 0–44)
AST: 20 U/L (ref 15–41)
Albumin: 4.5 g/dL (ref 3.5–5.0)
Anion gap: 8 (ref 5–15)
BUN: 22 mg/dL (ref 8–23)
CALCIUM: 8.7 mg/dL — AB (ref 8.9–10.3)
CO2: 23 mmol/L (ref 22–32)
CREATININE: 0.98 mg/dL (ref 0.61–1.24)
Chloride: 106 mmol/L (ref 98–111)
GFR calc Af Amer: >60 mL/min (ref >60)
Glucose, Bld: 120 mg/dL — ABNORMAL HIGH (ref 70–99)
Potassium: 3.8 mmol/L (ref 3.5–5.1)
Sodium: 137 mmol/L (ref 135–145)
Total Bilirubin: 0.7 mg/dL (ref 0.3–1.2)
Total Protein: 8.1 g/dL (ref 6.5–8.1)

## 2018-06-04 LAB — CBC
HCT: 43 % (ref 40.0–52.0)
Hemoglobin: 15 g/dL (ref 13.0–18.0)
MCH: 32.2 pg (ref 26.0–34.0)
MCHC: 34.8 g/dL (ref 32.0–36.0)
MCV: 92.4 fL (ref 80.0–100.0)
PLATELETS: 277 10*3/uL (ref 150–440)
RBC: 4.66 MIL/uL (ref 4.40–5.90)
RDW: 13.5 % (ref 11.5–14.5)
WBC: 11.4 10*3/uL — AB (ref 3.8–10.6)

## 2018-06-04 MED ORDER — SODIUM CHLORIDE 0.9 % IV BOLUS
1000.0000 mL | Freq: Once | INTRAVENOUS | Status: AC
  Administered 2018-06-04: 1000 mL via INTRAVENOUS

## 2018-06-04 MED ORDER — IOPAMIDOL (ISOVUE-300) INJECTION 61%
100.0000 mL | Freq: Once | INTRAVENOUS | Status: AC | PRN
  Administered 2018-06-04: 100 mL via INTRAVENOUS

## 2018-06-04 MED ORDER — GI COCKTAIL ~~LOC~~
30.0000 mL | Freq: Once | ORAL | Status: AC
  Administered 2018-06-04: 30 mL via ORAL
  Filled 2018-06-04: qty 30

## 2018-06-04 MED ORDER — OMEPRAZOLE 40 MG PO CPDR: 40.0000 mg | DELAYED_RELEASE_CAPSULE | Freq: Every day | ORAL | 0 refills | Status: AC

## 2018-06-04 NOTE — ED Notes (Signed)
Pt reports lower abd pain.  Pt was at [facility] today and sent to er for eval.    No n/v/d  No back pain.  No urinary sx.  Pt alert.  nsr on monitor.  Skin warm and dry  I v in place.

## 2018-06-04 NOTE — ED Provider Notes (Signed)
[facility] Emergency Department Provider Note  ____________________________________________  Time seen: Approximately 6:08 PM  I have reviewed the triage vital signs and the nursing notes.   HISTORY  Chief Complaint Abdominal Pain    HPI Derek Dawson is a 72 y.o. male with a history of HTN and melanoma, no history of abdominal surgeries, presenting with mid abdominal pain.  The patient reports that for the past 3 days he has had a constant sharp pain just above the umbilicus.  It is not associated, better or worse, with food or with withholding food.  It is not positional.  He does not have any chest pain, central burning sensation, or burping.  He has not tried anything for his pain.  He has been having normal bowel movements.  He has had no nausea or vomiting, fevers, testicular or scrotal pain, and has not had any sick contacts.  He reports remote endoscopy without any abnormalities many many years ago.  FH: Positive for 2 brothers with colon cancer. SH: Occasional alcohol use; denies cocaine or smoking  Past Medical History:  Diagnosis Date  . Cancer ([place])    melanoma  . Hypertension     There are no active problems to display for this patient.   Past Surgical History:  Procedure Laterality Date  . BACK SURGERY    . CATARACT EXTRACTION W/PHACO Left 09/25/2015   Procedure: CATARACT EXTRACTION PHACO AND INTRAOCULAR LENS PLACEMENT (IOC);  Surgeon: Estill Cotta, MD;  Location: [facility] ORS;  Service: Ophthalmology;  Laterality: Left;  [place] 01:28 AP% 25.5 CDE 37.96 fluid pack lot #[id number] [facility]  . COLONOSCOPY    . COLONOSCOPY WITH PROPOFOL N/A 06/13/2016   Procedure: COLONOSCOPY WITH PROPOFOL;  Surgeon: Lollie Sails, MD;  Location: [facility] ENDOSCOPY;  Service: Endoscopy;  Laterality: N/A;  . COLONOSCOPY WITH PROPOFOL N/A 06/14/2016   Procedure: COLONOSCOPY WITH PROPOFOL;  Surgeon: Lollie Sails, MD;  Location: [facility] ENDOSCOPY;  Service: Endoscopy;   Laterality: N/A;    Current Outpatient Rx  . Order #: [id number] Class: Historical Med  . Order #: [id number] Class: Historical Med  . Order #: [id number] Class: Historical Med  . Order #: [id number] Class: Historical Med  . Order #: [id number] Class: Historical Med  . Order #: [id number] Class: Historical Med  . Order #: [id number] Class: Print    Allergies Patient has no known allergies.  No family history on file.  Social History Social History   Tobacco Use  . Smoking status: Never Smoker  . Smokeless tobacco: Never Used  Substance Use Topics  . Alcohol use: Yes    Comment: occas  . Drug use: No    Review of Systems Constitutional: No fever/chills. Eyes: No visual changes. ENT: No sore throat. No congestion or rhinorrhea. Cardiovascular: Denies chest pain. Denies palpitations. Respiratory: Denies shortness of breath.  No cough. Gastrointestinal: Positive central periumbilical abdominal pain.  No nausea, no vomiting.  No diarrhea.  No constipation.  Normal bowel movements. Genitourinary: Negative for dysuria. Musculoskeletal: Negative for back pain. Skin: Negative for rash. Neurological: Negative for headaches. No focal numbness, tingling or weakness.     ____________________________________________   PHYSICAL EXAM:  VITAL SIGNS: ED Triage Vitals  Enc Vitals Group     BP 06/04/18 1748 137/71     Pulse Rate 06/04/18 1748 87     Resp 06/04/18 1748 18     Temp 06/04/18 1748 98.5 F (36.9 C)     Temp Source 06/04/18 1748 Oral     SpO2 06/04/18 1748 98 %  Weight 06/04/18 1748 186 lb (84.4 kg)     Height 06/04/18 1748 5\' 7"  (1.702 m)     Head Circumference --      Peak Flow --      Pain Score 06/04/18 1747 8     Pain Loc --      Pain Edu? --      Excl. in [place]? --     Constitutional: Alert and oriented. Answers questions appropriately. Eyes: Conjunctivae are normal.  EOMI. No scleral icterus. Head: Atraumatic. Nose: No congestion/rhinnorhea. Mouth/Throat:  Mucous membranes are moist.  Neck: No stridor.  Supple.  No meningismus. Cardiovascular: Normal rate, regular rhythm. No murmurs, rubs or gallops.  Respiratory: Normal respiratory effort.  No accessory muscle use or retractions. Lungs CTAB.  No wheezes, rales or ronchi. Gastrointestinal: Soft, and nondistended.  Tender to palpation in the epigastrium.  No right upper quadrant tenderness to palpation; negative Murphy sign.  No guarding or rebound.  No peritoneal signs. Musculoskeletal: No LE edema.  Neurologic:  A&Ox3.  Speech is clear.  Face and smile are symmetric.  EOMI.  Moves all extremities well. Skin:  Skin is warm, dry and intact. No rash noted. Psychiatric: Mood and affect are normal. Speech and behavior are normal.  Normal judgement. ____________________________________________   LABS (all labs ordered are listed, but only abnormal results are displayed)  Labs Reviewed  COMPREHENSIVE METABOLIC PANEL - Abnormal; Notable for the following components:      Result Value   Glucose, Bld 120 (*)    Calcium 8.7 (*)    All other components within normal limits  CBC - Abnormal; Notable for the following components:   WBC 11.4 (*)    All other components within normal limits  URINALYSIS, COMPLETE (UACMP) WITH MICROSCOPIC - Abnormal; Notable for the following components:   Color, Urine YELLOW (*)    APPearance CLEAR (*)    Ketones, ur 5 (*)    All other components within normal limits  LIPASE, BLOOD  TROPONIN I   ____________________________________________  EKG  ED ECG REPORT I, Anne-Caroline Mariea Clonts, the attending physician, personally viewed and interpreted this ECG.   Date: 06/04/2018  EKG Time: 1813  Rate: 79  Rhythm: normal sinus rhythm  Axis: normal  Intervals:first-degree A-V block   ST&T Change: No STEMI  ____________________________________________  RADIOLOGY  Ct Abdomen Pelvis W Contrast  Result Date: 06/04/2018 CLINICAL DATA:  Generalized abdominal pain  EXAM: CT ABDOMEN AND PELVIS WITH CONTRAST TECHNIQUE: Multidetector CT imaging of the abdomen and pelvis was performed using the standard protocol following bolus administration of intravenous contrast. CONTRAST:  138mL ISOVUE-300 IOPAMIDOL (ISOVUE-300) INJECTION 61% COMPARISON:  UGI 07/03/2005 FINDINGS: Lower chest: Lung bases demonstrate patchy dependent atelectasis. The heart size is within normal limits. Hepatobiliary: Subcentimeter hypodensities in the liver too small to further characterize. No calcified gallstone or biliary dilatation Pancreas: Unremarkable. No pancreatic ductal dilatation or surrounding inflammatory changes. Spleen: Normal in size without focal abnormality. Adrenals/Urinary Tract: Adrenal glands are within normal limits. Cysts within the left kidney. Subcentimeter hypodensities in the right kidney too small to further characterize. The bladder is normal Stomach/Bowel: Stomach is within normal limits. Appendix appears normal. No evidence of bowel wall thickening, distention, or inflammatory changes. Sigmoid colon diverticular disease without acute inflammatory process Vascular/Lymphatic: Nonaneurysmal aorta. No significantly enlarged lymph nodes Reproductive: Slightly enlarged prostate Other: Negative for free air or free fluid Musculoskeletal: No acute or significant osseous findings. IMPRESSION: No CT evidence for acute intra-abdominal or pelvic abnormality. Sigmoid colon  diverticular disease without acute inflammatory process Electronically Signed   By: Donavan Foil M.D.   On: 06/04/2018 19:04    ____________________________________________   PROCEDURES  Procedure(s) performed: None  Procedures  Critical Care performed: No ____________________________________________   INITIAL IMPRESSION / ASSESSMENT AND PLAN / ED COURSE  Pertinent labs & imaging results that were available during my care of the patient were reviewed by me and considered in my medical decision making (see  chart for details).  72 y.o. male with a native abdomen presenting with periumbilical and epigastric pain without any other associated symptoms.  It is possible that the patient's symptoms may be due to reflux although would expect changes with eating.  A peptic ulcer or gastric is also possible.  We will get a CT scan for further evaluation.  A GI cocktail has been ordered for symptomatic treatment.  Plan reevaluation for final disposition.  ----------------------------------------- 7:24 PM on 06/04/2018 -----------------------------------------  The patient's work-up in the emergency department has been reassuring.  He continues to be hemodynamically stable and is feeling better.  His electrolytes are within normal limits and his lipase is normal.  His white blood cell count is minimally elevated at 11.4, which is nonspecific.  His blood counts are otherwise normal.  His troponin is less than 0.03.  His CT scan does not show any acute intra-abdominal pathology.  I will plan to treat the patient for GERD or ulcer disease with omeprazole daily, and give him dietary specifications.  Follow-up instructions and return precautions were discussed with the patient and his wife. ____________________________________________  FINAL CLINICAL IMPRESSION(S) / ED DIAGNOSES  Final diagnoses:  Pain of upper abdomen         NEW MEDICATIONS STARTED DURING THIS VISIT:  New Prescriptions   OMEPRAZOLE (PRILOSEC) 40 MG CAPSULE    Take 1 capsule (40 mg total) by mouth daily.      Eula Listen, MD 06/04/18 1925

## 2018-06-04 NOTE — ED Triage Notes (Signed)
Patient to ER for mid abd pain x3 days. Denies any nausea or vomiting. Patient reports pain as a constant sharp pain.

## 2018-06-04 NOTE — ED Notes (Signed)
ED Provider at bedside. 

## 2018-06-04 NOTE — Discharge Instructions (Addendum)
Please follow the instructions for the GERD diet, to see if this improves your symptoms.  You may take Tylenol for your pain, but avoid NSAID medications including Advil, ibuprofen, Motrin or Aleve.  Please take omeprazole daily, even if your symptoms improve.  Return to the emergency department if you develop severe pain, nausea or vomiting, fevers or chills, lightheadedness or fainting, or any other symptoms concerning to you.

## 2018-07-24 ENCOUNTER — Encounter: Payer: Self-pay | Admitting: *Deleted

## 2018-07-27 ENCOUNTER — Other Ambulatory Visit: Payer: Self-pay

## 2018-07-27 ENCOUNTER — Ambulatory Visit: Payer: Medicare Other | Admitting: Anesthesiology

## 2018-07-27 ENCOUNTER — Encounter: Payer: Self-pay | Admitting: *Deleted

## 2018-07-27 ENCOUNTER — Ambulatory Visit
Admission: RE | Admit: 2018-07-27 | Discharge: 2018-07-27 | Disposition: A | Discharge status: Home / Self Care | Payer: Medicare Other | Source: Ambulatory Visit | Attending: Gastroenterology | Admitting: Gastroenterology

## 2018-07-27 ENCOUNTER — Encounter
Admission: RE | Disposition: A | Discharge status: Home / Self Care | Payer: Self-pay | Source: Ambulatory Visit | Attending: Gastroenterology

## 2018-07-27 DIAGNOSIS — Z79899 Other long term (current) drug therapy: Secondary | ICD-10-CM | POA: Insufficient documentation

## 2018-07-27 DIAGNOSIS — R7303 Prediabetes: Secondary | ICD-10-CM | POA: Insufficient documentation

## 2018-07-27 DIAGNOSIS — K2210 Ulcer of esophagus without bleeding: Secondary | ICD-10-CM | POA: Insufficient documentation

## 2018-07-27 DIAGNOSIS — K449 Diaphragmatic hernia without obstruction or gangrene: Secondary | ICD-10-CM | POA: Diagnosis not present

## 2018-07-27 DIAGNOSIS — Z7982 Long term (current) use of aspirin: Secondary | ICD-10-CM | POA: Diagnosis not present

## 2018-07-27 DIAGNOSIS — Z85820 Personal history of malignant melanoma of skin: Secondary | ICD-10-CM | POA: Insufficient documentation

## 2018-07-27 DIAGNOSIS — K2970 Gastritis, unspecified, without bleeding: Secondary | ICD-10-CM | POA: Diagnosis not present

## 2018-07-27 DIAGNOSIS — K210 Gastro-esophageal reflux disease with esophagitis: Secondary | ICD-10-CM | POA: Diagnosis not present

## 2018-07-27 DIAGNOSIS — I10 Essential (primary) hypertension: Secondary | ICD-10-CM | POA: Insufficient documentation

## 2018-07-27 DIAGNOSIS — R1013 Epigastric pain: Secondary | ICD-10-CM | POA: Diagnosis present

## 2018-07-27 HISTORY — DX: Prediabetes: R73.03

## 2018-07-27 HISTORY — DX: Angina pectoris, unspecified: I20.9

## 2018-07-27 HISTORY — PX: ESOPHAGOGASTRODUODENOSCOPY (EGD) WITH PROPOFOL: SHX5813

## 2018-07-27 SURGERY — ESOPHAGOGASTRODUODENOSCOPY (EGD) WITH PROPOFOL
Anesthesia: General

## 2018-07-27 MED ORDER — SODIUM CHLORIDE 0.9 % IV SOLN
INTRAVENOUS | Status: DC
  Administered 2018-07-27: 11:00:00 via INTRAVENOUS

## 2018-07-27 MED ORDER — PROPOFOL 10 MG/ML IV BOLUS
INTRAVENOUS | Status: DC | PRN
  Administered 2018-07-27 (×4): 20 mg via INTRAVENOUS
  Administered 2018-07-27: 90 mg via INTRAVENOUS
  Administered 2018-07-27: 20 mg via INTRAVENOUS

## 2018-07-27 NOTE — Anesthesia Post-op Follow-up Note (Signed)
Anesthesia QCDR form completed.        

## 2018-07-27 NOTE — Transfer of Care (Signed)
Immediate Anesthesia Transfer of Care Note  Patient: Derek Dawson  Procedure(s) Performed: ESOPHAGOGASTRODUODENOSCOPY (EGD) WITH PROPOFOL (N/A )  Patient Location: Endoscopy Unit  Anesthesia Type:General  Level of Consciousness: drowsy and patient cooperative  Airway & Oxygen Therapy: Patient Spontanous Breathing and Patient connected to nasal cannula oxygen  Post-op Assessment: Report given to RN and Post -op Vital signs reviewed and stable  Post vital signs: Reviewed and stable  Last Vitals:  Vitals Value Taken Time  BP 107/66 07/27/2018 11:10 AM  Temp 36.4 C 07/27/2018 11:00 AM  Pulse 84 07/27/2018 11:10 AM  Resp 16 07/27/2018 11:10 AM  SpO2 96 % 07/27/2018 11:10 AM  Vitals shown include unvalidated device data.  Last Pain:  Vitals:   07/27/18 1100  TempSrc: Tympanic         Complications: No apparent anesthesia complications

## 2018-07-27 NOTE — Op Note (Signed)
[facility] Gastroenterology Patient Name: Derek Dawson Procedure Date: 07/27/2018 10:36 AM MRN: [id number] Account #: [id number] Date of Birth: [birth year] Admit Type: Outpatient Age: 72 Room: [facility] ENDO ROOM 3 Gender: Male Note Status: Finalized Procedure:            Upper GI endoscopy Indications:          Epigastric abdominal pain Providers:            Lollie Sails, MD Referring MD:         Youlanda Roys. Lovie Macadamia, MD (Referring MD) Medicines:            Monitored Anesthesia Care Complications:        No immediate complications. Procedure:            Pre-Anesthesia Assessment:                       - ASA Grade Assessment: II - A patient with mild                        systemic disease.                       After obtaining informed consent, the endoscope was                        passed under direct vision. Throughout the procedure,                        the patient's blood pressure, pulse, and oxygen                        saturations were monitored continuously. The Endoscope                        was introduced through the mouth, and advanced to the                        third part of duodenum. The patient tolerated the                        procedure well. Findings:      LA Grade B (one or more mucosal breaks greater than 5 mm, not extending       between the tops of two mucosal folds) esophagitis with no bleeding was       found. Biopsies were taken with a cold forceps for histology.      The exam of the esophagus was otherwise normal.      Diffuse and patchy mild inflammation characterized by erosions and       erythema was found in the gastric body and in the gastric antrum.       Biopsies were taken with a cold forceps for histology. Biopsies were       taken with a cold forceps for Helicobacter pylori testing.      The cardia and gastric fundus were normal on retroflexion.      Patchy minimal inflammation characterized by granularity was  found in       the duodenal bulb.      The exam of the duodenum was otherwise normal. Impression:           - LA Grade B erosive esophagitis.  Biopsied.                       - Gastritis. Biopsied.                       - Duodenitis. Recommendation:       - Use Prilosec (omeprazole) 40 mg PO BID for 2 weeks.                       - Use Prilosec (omeprazole) 40 mg PO daily.                       - Return to GI clinic in 5 weeks. Procedure Code(s):    --- Professional ---                       [phone], Esophagogastroduodenoscopy, flexible, transoral;                        with biopsy, single or multiple Diagnosis Code(s):    --- Professional ---                       K20.8, Other esophagitis                       K29.70, Gastritis, unspecified, without bleeding                       K29.80, Duodenitis without bleeding                       R10.13, Epigastric pain CPT copyright 2018 American Medical Association. All rights reserved. The codes documented in this report are preliminary and upon coder review may  be revised to meet current compliance requirements. Lollie Sails, MD 07/27/2018 11:09:44 AM This report has been signed electronically. Number of Addenda: 0 Note Initiated On: 07/27/2018 10:36 AM      [facility]

## 2018-07-27 NOTE — Anesthesia Preprocedure Evaluation (Signed)
Anesthesia Evaluation  Patient identified by MRN, date of birth, ID band Patient awake    Reviewed: Allergy & Precautions, NPO status , Patient's Chart, lab work & pertinent test results  History of Anesthesia Complications Negative for: history of anesthetic complications  Airway Mallampati: II       Dental   Pulmonary neg sleep apnea, neg COPD,           Cardiovascular hypertension, Pt. on medications (-) Past MI and (-) CHF (-) dysrhythmias (-) Valvular Problems/Murmurs     Neuro/Psych neg Seizures    GI/Hepatic Neg liver ROS, GERD  Medicated,  Endo/Other  diabetes ([place] )  Renal/GU negative Renal ROS     Musculoskeletal   Abdominal   Peds  Hematology   Anesthesia Other Findings   Reproductive/Obstetrics                            Anesthesia Physical Anesthesia Plan  ASA: II  Anesthesia Plan: General   Post-op Pain Management:    Induction:   PONV Risk Score and Plan: 2 and Propofol infusion and TIVA  Airway Management Planned: Nasal Cannula  Additional Equipment:   Intra-op Plan:   Post-operative Plan:   Informed Consent: I have reviewed the patients History and Physical, chart, labs and discussed the procedure including the risks, benefits and alternatives for the proposed anesthesia with the patient or authorized representative who has indicated his/her understanding and acceptance.     Plan Discussed with:   Anesthesia Plan Comments:         Anesthesia Quick Evaluation

## 2018-07-27 NOTE — H&P (Addendum)
Outpatient short stay form Pre-procedure 07/27/2018 10:41 AM Lollie Sails MD  Primary Physician: Dr. Juluis Pitch  Reason for visit: EGD  History of present illness: Patient is a 72 year old male presenting today for an EGD in regards to problems with epigastric pain.  He was seen little over week ago in the GI clinic this complaint.  He was seen in the ER about a month ago for epigastric abdominal pain.  At that time he was taking high-dose Motrin.  He is not taking that medication currently.  He has been placed on a proton pump inhibitor.  He is feeling a little better.  Takes no other aspirin products or blood thinning agent with the exception of 81 mg aspirin.    Current Facility-Administered Medications:  .  0.9 %  sodium chloride infusion, , Intravenous, Continuous, Lollie Sails, MD, Last Rate: 20 mL/hr at 07/27/18 1030  Medications Prior to Admission  Medication Sig Dispense Refill Last Dose  . cyclobenzaprine (FLEXERIL) 10 MG tablet Take 10 mg by mouth daily.   Past Week at Unknown time  . lisinopril (PRINIVIL,ZESTRIL) 40 MG tablet Take 40 mg by mouth daily.   Past Week at Unknown time  . lovastatin (MEVACOR) 40 MG tablet Take 40 mg by mouth at bedtime.   Past Week at Unknown time  . omeprazole (PRILOSEC) 40 MG capsule Take 1 capsule (40 mg total) by mouth daily. 30 capsule 0 Past Week at Unknown time  . amLODipine (NORVASC) 5 MG tablet Take 5 mg by mouth daily.   Past Week at Unknown time  . aspirin 81 MG tablet Take 81 mg by mouth daily.   Not Taking at Unknown time  . loratadine (CLARITIN) 10 MG tablet Take 10 mg by mouth daily.   Not Taking at Unknown time     No Known Allergies   Past Medical History:  Diagnosis Date  . Anginal pain ([place])   . Cancer ([place])    melanoma  . Hypertension   . Pre-diabetes     Review of systems:      Physical Exam    Heart and lungs: Regular rate and rhythm without rub or gallop, lungs are bilaterally clear.    HEENT:  Normocephalic atraumatic eyes are anicteric    Other:    Pertinant exam for procedure: Soft nontender, mild discomfort to palpation to the right of midline in the lower portion of the right upper quadrant.  There is a firmness noted in the area to palpation. No rebound.  Nondistended bowel sounds positive normoactive.  Patient had a CT scan of the abdomen and pelvis 19 2019 that showed no CT evidence for acute intra-abdominal or pelvic abnormality as well as some sigmoid colon diverticular evidence without inflammatory process.    Planned proceedures: Indicated procedures. I have discussed the risks benefits and complications of procedures to include not limited to bleeding, infection, perforation and the risk of sedation and the patient wishes to proceed.    Lollie Sails, MD Gastroenterology 07/27/2018  10:41 AM

## 2018-07-27 NOTE — Anesthesia Postprocedure Evaluation (Signed)
Anesthesia Post Note  Patient: Derek Dawson  Procedure(s) Performed: ESOPHAGOGASTRODUODENOSCOPY (EGD) WITH PROPOFOL (N/A )  Patient location during evaluation: Endoscopy Anesthesia Type: General Level of consciousness: awake and alert Pain management: pain level controlled Vital Signs Assessment: post-procedure vital signs reviewed and stable Respiratory status: spontaneous breathing and respiratory function stable Cardiovascular status: stable Anesthetic complications: no     Last Vitals:  Vitals:   07/27/18 1100 07/27/18 1110  BP:  107/66  Pulse:  67  Resp:  (!) 22  Temp: 36.4 C   SpO2:  95%    Last Pain:  Vitals:   07/27/18 1100  TempSrc: Tympanic                 Lizania Bouchard K

## 2018-07-28 ENCOUNTER — Encounter: Payer: Self-pay | Admitting: Gastroenterology

## 2018-07-29 LAB — SURGICAL PATHOLOGY

## 2019-04-20 ENCOUNTER — Other Ambulatory Visit: Payer: Self-pay

## 2019-04-20 ENCOUNTER — Encounter: Payer: Self-pay | Admitting: *Deleted

## 2019-04-21 NOTE — Discharge Instructions (Signed)

## 2019-04-23 ENCOUNTER — Other Ambulatory Visit: Payer: Self-pay

## 2019-04-23 ENCOUNTER — Other Ambulatory Visit
Admission: RE | Admit: 2019-04-23 | Discharge: 2019-04-23 | Disposition: A | Discharge status: Home / Self Care | Payer: Medicare Other | Source: Ambulatory Visit | Attending: Ophthalmology | Admitting: Ophthalmology

## 2019-04-23 DIAGNOSIS — Z01812 Encounter for preprocedural laboratory examination: Secondary | ICD-10-CM | POA: Diagnosis present

## 2019-04-23 DIAGNOSIS — Z20828 Contact with and (suspected) exposure to other viral communicable diseases: Secondary | ICD-10-CM | POA: Diagnosis not present

## 2019-04-24 LAB — SARS CORONAVIRUS 2 (TAT 6-24 HRS): SARS Coronavirus 2: NEGATIVE

## 2019-04-27 ENCOUNTER — Encounter
Admission: RE | Disposition: A | Discharge status: Home / Self Care | Payer: Self-pay | Source: Home / Self Care | Attending: Ophthalmology

## 2019-04-27 ENCOUNTER — Ambulatory Visit: Payer: Medicare Other | Admitting: Anesthesiology

## 2019-04-27 ENCOUNTER — Other Ambulatory Visit: Payer: Self-pay

## 2019-04-27 ENCOUNTER — Ambulatory Visit
Admission: RE | Admit: 2019-04-27 | Discharge: 2019-04-27 | Disposition: A | Discharge status: Home / Self Care | Payer: Medicare Other | Attending: Ophthalmology | Admitting: Ophthalmology

## 2019-04-27 DIAGNOSIS — Z79899 Other long term (current) drug therapy: Secondary | ICD-10-CM | POA: Insufficient documentation

## 2019-04-27 DIAGNOSIS — H2511 Age-related nuclear cataract, right eye: Secondary | ICD-10-CM | POA: Diagnosis present

## 2019-04-27 DIAGNOSIS — E7800 Pure hypercholesterolemia, unspecified: Secondary | ICD-10-CM | POA: Insufficient documentation

## 2019-04-27 DIAGNOSIS — I10 Essential (primary) hypertension: Secondary | ICD-10-CM | POA: Insufficient documentation

## 2019-04-27 DIAGNOSIS — K219 Gastro-esophageal reflux disease without esophagitis: Secondary | ICD-10-CM | POA: Diagnosis not present

## 2019-04-27 DIAGNOSIS — E785 Hyperlipidemia, unspecified: Secondary | ICD-10-CM | POA: Insufficient documentation

## 2019-04-27 DIAGNOSIS — E1136 Type 2 diabetes mellitus with diabetic cataract: Secondary | ICD-10-CM | POA: Diagnosis not present

## 2019-04-27 HISTORY — DX: Family history of other specified conditions: Z84.89

## 2019-04-27 HISTORY — PX: CATARACT EXTRACTION W/PHACO: SHX586

## 2019-04-27 HISTORY — DX: Gastro-esophageal reflux disease without esophagitis: K21.9

## 2019-04-27 SURGERY — PHACOEMULSIFICATION, CATARACT, WITH IOL INSERTION
Anesthesia: Monitor Anesthesia Care | Site: Eye | Laterality: Right

## 2019-04-27 MED ORDER — LACTATED RINGERS IV SOLN: INTRAVENOUS | Status: DC

## 2019-04-27 MED ORDER — EPINEPHRINE PF 1 MG/ML IJ SOLN
INTRAOCULAR | Status: DC | PRN
  Administered 2019-04-27: 09:00:00 75 mL via OPHTHALMIC

## 2019-04-27 MED ORDER — BRIMONIDINE TARTRATE-TIMOLOL 0.2-0.5 % OP SOLN
OPHTHALMIC | Status: DC | PRN
  Administered 2019-04-27: 1 [drp] via OPHTHALMIC

## 2019-04-27 MED ORDER — MIDAZOLAM HCL 2 MG/2ML IJ SOLN
INTRAMUSCULAR | Status: DC | PRN
  Administered 2019-04-27: 2 mg via INTRAVENOUS

## 2019-04-27 MED ORDER — TETRACAINE HCL 0.5 % OP SOLN
1.0000 [drp] | OPHTHALMIC | Status: DC | PRN
  Administered 2019-04-27 (×3): 1 [drp] via OPHTHALMIC

## 2019-04-27 MED ORDER — LIDOCAINE HCL (PF) 2 % IJ SOLN
INTRAOCULAR | Status: DC | PRN
  Administered 2019-04-27: 1 mL

## 2019-04-27 MED ORDER — ARMC OPHTHALMIC DILATING DROPS
1.0000 "application " | OPHTHALMIC | Status: DC | PRN
  Administered 2019-04-27 (×3): 1 via OPHTHALMIC

## 2019-04-27 MED ORDER — FENTANYL CITRATE (PF) 100 MCG/2ML IJ SOLN
INTRAMUSCULAR | Status: DC | PRN
  Administered 2019-04-27: 50 ug via INTRAVENOUS

## 2019-04-27 MED ORDER — MOXIFLOXACIN HCL 0.5 % OP SOLN
OPHTHALMIC | Status: DC | PRN
  Administered 2019-04-27: 0.2 mL via OPHTHALMIC

## 2019-04-27 MED ORDER — NA CHONDROIT SULF-NA HYALURON 40-17 MG/ML IO SOLN
INTRAOCULAR | Status: DC | PRN
  Administered 2019-04-27: 1 mL via INTRAOCULAR

## 2019-04-27 SURGICAL SUPPLY — 19 items
CANNULA ANT/CHMB 27GA (MISCELLANEOUS) ×3 IMPLANT
GLOVE SURG LX 8.0 MICRO (GLOVE) ×2
GLOVE SURG LX STRL 8.0 MICRO (GLOVE) ×1 IMPLANT
GLOVE SURG TRIUMPH 8.0 PF LTX (GLOVE) ×3 IMPLANT
GOWN STRL REUS W/ TWL LRG LVL3 (GOWN DISPOSABLE) ×2 IMPLANT
GOWN STRL REUS W/TWL LRG LVL3 (GOWN DISPOSABLE) ×4
LENS IOL ACRSF IQ ULTRA 18.0 (Intraocular Lens) ×1 IMPLANT
LENS IOL ACRYSOF IQ 18.0 (Intraocular Lens) ×3 IMPLANT
MARKER SKIN DUAL TIP RULER LAB (MISCELLANEOUS) ×3 IMPLANT
NDL RETROBULBAR .5 NSTRL (NEEDLE) ×3 IMPLANT
NEEDLE FILTER BLUNT 18X 1/2SAF (NEEDLE) ×2
NEEDLE FILTER BLUNT 18X1 1/2 (NEEDLE) ×1 IMPLANT
PACK EYE AFTER SURG (MISCELLANEOUS) ×3 IMPLANT
PACK OPTHALMIC (MISCELLANEOUS) ×3 IMPLANT
PACK PORFILIO (MISCELLANEOUS) ×3 IMPLANT
SYR 3ML LL SCALE MARK (SYRINGE) ×3 IMPLANT
SYR TB 1ML LUER SLIP (SYRINGE) ×3 IMPLANT
WATER STERILE IRR 250ML POUR (IV SOLUTION) ×3 IMPLANT
WIPE NON LINTING 3.25X3.25 (MISCELLANEOUS) ×3 IMPLANT

## 2019-04-27 NOTE — H&P (Signed)
All labs reviewed. Abnormal studies sent to patients PCP when indicated.  Previous H&P reviewed, patient examined, there are NO CHANGES.  Derek Frieze Porfilio8/11/20208:33 AM

## 2019-04-27 NOTE — Op Note (Signed)
PREOPERATIVE DIAGNOSIS:  Nuclear sclerotic cataract of the right eye.   POSTOPERATIVE DIAGNOSIS:  H25.11 CATARACT   OPERATIVE PROCEDURE: Procedure(s): CATARACT EXTRACTION PHACO AND INTRAOCULAR LENS PLACEMENT (IOC) RIGHT DIABETES-DIET CONTROLLED   SURGEON:  Birder Robson, MD.   ANESTHESIA:  Anesthesiologist: Veda Canning, MD CRNA: Cameron Ali, CRNA  1.      Managed anesthesia care. 2.      0.53ml of Shugarcaine was instilled in the eye following the paracentesis.   COMPLICATIONS:  None.   TECHNIQUE:   Stop and chop   DESCRIPTION OF PROCEDURE:  The patient was examined and consented in the preoperative holding area where the aforementioned topical anesthesia was applied to the right eye and then brought back to the Operating Room where the right eye was prepped and draped in the usual sterile ophthalmic fashion and a lid speculum was placed. A paracentesis was created with the side port blade and the anterior chamber was filled with viscoelastic. A near clear corneal incision was performed with the steel keratome. A continuous curvilinear capsulorrhexis was performed with a cystotome followed by the capsulorrhexis forceps. Hydrodissection and hydrodelineation were carried out with BSS on a blunt cannula. The lens was removed in a stop and chop  technique and the remaining cortical material was removed with the irrigation-aspiration handpiece. The capsular bag was inflated with viscoelastic and the Technis [id number]  lens was placed in the capsular bag without complication. The remaining viscoelastic was removed from the eye with the irrigation-aspiration handpiece. The wounds were hydrated. The anterior chamber was flushed with BSS and the eye was inflated to physiologic pressure. 0.59ml of Vigamox was placed in the anterior chamber. The wounds were found to be water tight. The eye was dressed with Combigan. The patient was given protective glasses to wear throughout the day and a shield with which  to sleep tonight. The patient was also given drops with which to begin a drop regimen today and will follow-up with me in one day. Implant Name Type Inv. Item Serial No. Manufacturer Lot No. LRB No. Used Action  LENS IOL ACRYSOF IQ 18.0 - [id number] Intraocular Lens LENS IOL ACRYSOF IQ 18.0 [id number] ALCON  Right 1 Implanted   Procedure(s): CATARACT EXTRACTION PHACO AND INTRAOCULAR LENS PLACEMENT (IOC) RIGHT DIABETES-DIET CONTROLLED (Right)  Electronically signed: Birder Robson 04/27/2019 9:02 AM

## 2019-04-27 NOTE — Anesthesia Preprocedure Evaluation (Signed)
Anesthesia Evaluation  Patient identified by MRN, date of birth, ID band Patient awake    Reviewed: Allergy & Precautions, NPO status , Patient's Chart, lab work & pertinent test results  Airway Mallampati: II  TM Distance: >3 FB     Dental   Pulmonary neg pulmonary ROS,    breath sounds clear to auscultation       Cardiovascular hypertension, (-) angina Rhythm:Regular Rate:Normal  HLD   Neuro/Psych    GI/Hepatic GERD  ,  Endo/Other  diabetes (prediabetes)  Renal/GU      Musculoskeletal   Abdominal   Peds  Hematology   Anesthesia Other Findings   Reproductive/Obstetrics                             Anesthesia Physical Anesthesia Plan  ASA: II  Anesthesia Plan: MAC   Post-op Pain Management:    Induction: Intravenous  PONV Risk Score and Plan:   Airway Management Planned: Natural Airway and Nasal Cannula  Additional Equipment:   Intra-op Plan:   Post-operative Plan:   Informed Consent: I have reviewed the patients History and Physical, chart, labs and discussed the procedure including the risks, benefits and alternatives for the proposed anesthesia with the patient or authorized representative who has indicated his/her understanding and acceptance.       Plan Discussed with: CRNA  Anesthesia Plan Comments:         Anesthesia Quick Evaluation

## 2019-04-27 NOTE — Anesthesia Procedure Notes (Signed)
Procedure Name: MAC Date/Time: 04/27/2019 8:43 AM Performed by: Cameron Ali, CRNA Pre-anesthesia Checklist: Patient identified, Emergency Drugs available, Suction available, Timeout performed and Patient being monitored Patient Re-evaluated:Patient Re-evaluated prior to induction Oxygen Delivery Method: Nasal cannula Placement Confirmation: positive ETCO2

## 2019-04-27 NOTE — Anesthesia Postprocedure Evaluation (Signed)
Anesthesia Post Note  Patient: Derek Dawson  Procedure(s) Performed: CATARACT EXTRACTION PHACO AND INTRAOCULAR LENS PLACEMENT (IOC) RIGHT DIABETES-DIET CONTROLLED (Right Eye)  Patient location during evaluation: PACU Anesthesia Type: MAC Level of consciousness: awake and alert Pain management: pain level controlled Vital Signs Assessment: post-procedure vital signs reviewed and stable Respiratory status: spontaneous breathing, nonlabored ventilation, respiratory function stable and patient connected to nasal cannula oxygen Cardiovascular status: stable and blood pressure returned to baseline Postop Assessment: no apparent nausea or vomiting Anesthetic complications: no    Veda Canning

## 2019-04-27 NOTE — Transfer of Care (Signed)
Immediate Anesthesia Transfer of Care Note  Patient: Derek Dawson  Procedure(s) Performed: CATARACT EXTRACTION PHACO AND INTRAOCULAR LENS PLACEMENT (IOC) RIGHT DIABETES-DIET CONTROLLED (Right Eye)  Patient Location: PACU  Anesthesia Type: MAC  Level of Consciousness: awake, alert  and patient cooperative  Airway and Oxygen Therapy: Patient Spontanous Breathing and Patient connected to supplemental oxygen  Post-op Assessment: Post-op Vital signs reviewed, Patient's Cardiovascular Status Stable, Respiratory Function Stable, Patent Airway and No signs of Nausea or vomiting  Post-op Vital Signs: Reviewed and stable  Complications: No apparent anesthesia complications

## 2019-04-28 ENCOUNTER — Encounter: Payer: Self-pay | Admitting: Ophthalmology

## 2020-05-15 DIAGNOSIS — K219 Gastro-esophageal reflux disease without esophagitis: Secondary | ICD-10-CM | POA: Diagnosis present

## 2020-05-25 ENCOUNTER — Other Ambulatory Visit: Payer: Self-pay

## 2020-05-25 ENCOUNTER — Encounter: Payer: Self-pay | Admitting: Dermatology

## 2020-05-25 ENCOUNTER — Ambulatory Visit (INDEPENDENT_AMBULATORY_CARE_PROVIDER_SITE_OTHER): Payer: Medicare Other | Admitting: Dermatology

## 2020-05-25 DIAGNOSIS — D225 Melanocytic nevi of trunk: Secondary | ICD-10-CM | POA: Diagnosis not present

## 2020-05-25 DIAGNOSIS — L578 Other skin changes due to chronic exposure to nonionizing radiation: Secondary | ICD-10-CM

## 2020-05-25 DIAGNOSIS — L814 Other melanin hyperpigmentation: Secondary | ICD-10-CM

## 2020-05-25 DIAGNOSIS — Z86006 Personal history of melanoma in-situ: Secondary | ICD-10-CM

## 2020-05-25 DIAGNOSIS — D485 Neoplasm of uncertain behavior of skin: Secondary | ICD-10-CM

## 2020-05-25 DIAGNOSIS — Z1283 Encounter for screening for malignant neoplasm of skin: Secondary | ICD-10-CM

## 2020-05-25 DIAGNOSIS — L738 Other specified follicular disorders: Secondary | ICD-10-CM

## 2020-05-25 DIAGNOSIS — D1800 Hemangioma unspecified site: Secondary | ICD-10-CM

## 2020-05-25 DIAGNOSIS — D229 Melanocytic nevi, unspecified: Secondary | ICD-10-CM

## 2020-05-25 DIAGNOSIS — L988 Other specified disorders of the skin and subcutaneous tissue: Secondary | ICD-10-CM

## 2020-05-25 DIAGNOSIS — Z86018 Personal history of other benign neoplasm: Secondary | ICD-10-CM

## 2020-05-25 DIAGNOSIS — L821 Other seborrheic keratosis: Secondary | ICD-10-CM

## 2020-05-25 NOTE — Patient Instructions (Addendum)
Melanoma ABCDEs  Melanoma is the most dangerous type of skin cancer, and is the leading cause of death from skin disease.  You are more likely to develop melanoma if you:  Have light-colored skin, light-colored eyes, or red or blond hair  Spend a lot of time in the sun  Tan regularly, either outdoors or in a tanning bed  Have had blistering sunburns, especially during childhood  Have a close family member who has had a melanoma  Have atypical moles or large birthmarks  Early detection of melanoma is key since treatment is typically straightforward and cure rates are extremely high if we catch it early.   The first sign of melanoma is often a change in a mole or a new dark spot.  The ABCDE system is a way of remembering the signs of melanoma.  A for asymmetry:  The two halves do not match. B for border:  The edges of the growth are irregular. C for color:  A mixture of colors are present instead of an even brown color. D for diameter:  Melanomas are usually (but not always) greater than 6mm - the size of a pencil eraser. E for evolution:  The spot keeps changing in size, shape, and color.  Please check your skin once per month between visits. You can use a small mirror in front and a large mirror behind you to keep an eye on the back side or your body.   If you see any new or changing lesions before your next follow-up, please call to schedule a visit.  Please continue daily skin protection including broad spectrum sunscreen SPF 30+ to sun-exposed areas, reapplying every 2 hours as needed when you're outdoors.   Wound Care Instructions  1. Cleanse wound gently with soap and water once a day then pat dry with clean gauze. Apply a thing coat of Petrolatum (petroleum jelly, "Vaseline") over the wound (unless you have an allergy to this). We recommend that you use a new, sterile tube of Vaseline. Do not pick or remove scabs. Do not remove the yellow or white "healing tissue" from the  base of the wound.  2. Cover the wound with fresh, clean, nonstick gauze and secure with paper tape. You may use Band-Aids in place of gauze and tape if the would is small enough, but would recommend trimming much of the tape off as there is often too much. Sometimes Band-Aids can irritate the skin.  3. You should call the office for your biopsy report after 1 week if you have not already been contacted.  4. If you experience any problems, such as abnormal amounts of bleeding, swelling, significant bruising, significant pain, or evidence of infection, please call the office immediately.  5. FOR ADULT SURGERY PATIENTS: If you need something for pain relief you may take 1 extra strength Tylenol (acetaminophen) AND 2 Ibuprofen (200mg each) together every 4 hours as needed for pain. (do not take these if you are allergic to them or if you have a reason you should not take them.) Typically, you may only need pain medication for 1 to 3 days.      

## 2020-05-25 NOTE — Progress Notes (Signed)
Follow-Up Visit   Subjective  Derek Dawson is a 74 y.o. 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 The patient presents for Total-Body Skin Exam (TBSE) for skin cancer screening and mole check.  The following portions of the chart were reviewed this encounter and updated as appropriate:  Tobacco  Allergies  Meds  Problems  Med Hx  Surg Hx  Fam Hx     Review of Systems:  No other skin or systemic complaints except as noted in HPI or Assessment and Plan.  Objective  Well appearing patient in no apparent distress; mood and affect are within normal limits.  A full examination was performed including scalp, head, eyes, ears, nose, lips, neck, chest, axillae, abdomen, back, buttocks, bilateral upper extremities, bilateral lower extremities, hands, feet, fingers, toes, fingernails, and toenails. All findings within normal limits unless otherwise noted below.  Objective  Head - Anterior (Face): Small yellow papules with a central dell.   Objective  upper back spinal: 0.7 cm firm SQ nodule   Objective  L abdomen above the lat waistline: 0.6 cm irregular brown macule   Assessment & Plan  Sebaceous hyperplasia Head - Anterior (Face)  Benign, observe.    Elastosis of skin upper back spinal  Benign, observe.    Neoplasm of uncertain behavior of skin L abdomen above the lat waistline  Epidermal / dermal shaving  Lesion diameter (cm):  0.6 Informed consent: discussed and consent obtained   Timeout: patient name, date of birth, surgical site, and procedure verified   Procedure prep:  Patient was prepped and draped in usual sterile fashion Prep type:  Isopropyl alcohol Anesthesia: the lesion was anesthetized in a standard fashion   Anesthetic:  1% lidocaine w/ epinephrine 1-100,000 buffered w/ 8.4% NaHCO3 Instrument used: flexible razor blade   Hemostasis achieved with: pressure, aluminum chloride and electrodesiccation     Outcome: patient tolerated procedure well   Post-procedure details: sterile dressing applied and wound care instructions given   Dressing type: bandage and petrolatum    Specimen 1 - Surgical pathology Differential Diagnosis: D48.5 r/o dysplastic nevus  Check Margins: No 0.6 cm irregular brown macule  Skin cancer screening   Lentigines - Scattered tan macules - Discussed due to sun exposure - Benign, observe - Call for any changes  Seborrheic Keratoses - Stuck-on, waxy, tan-brown papules and plaques  - Discussed benign etiology and prognosis. - Observe - Call for any changes  Melanocytic Nevi - Tan-brown and/or pink-flesh-colored symmetric macules and papules - Benign appearing on exam today - Observation - Call clinic for new or changing moles - Recommend daily use of broad spectrum spf 30+ sunscreen to sun-exposed areas.   Hemangiomas - Red papules - Discussed benign nature - Observe - Call for any changes  Actinic Damage - diffuse scaly erythematous macules with underlying dyspigmentation - Recommend daily broad spectrum sunscreen SPF 30+ to sun-exposed areas, reapply every 2 hours as needed.  - Call for new or changing lesions.  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  - Call if any new or changing lesions are noted between office visits  History of Dysplastic Nevi - numerous, see history  -  No evidence of recurrence today - Recommend regular full body skin exams - Recommend daily broad spectrum sunscreen SPF 30+ to sun-exposed areas, reapply every 2 hours as needed.  - Call if any new or changing lesions are noted between office visits  Skin cancer screening  performed today.  Return in about 6 months (around 11/22/2020) for TBSE - hx MMIS .  Luther Redo, CMA, am acting as scribe for [name], MD .  Documentation: I have reviewed the above documentation for accuracy and completeness, and I agree with the above.  [name], MD

## 2020-05-26 ENCOUNTER — Encounter: Payer: Self-pay | Admitting: Dermatology

## 2020-05-31 ENCOUNTER — Telehealth: Payer: Self-pay

## 2020-05-31 NOTE — Telephone Encounter (Signed)
-----   Message from Ralene Bathe, MD sent at 05/30/2020  6:01 PM EDT ----- Skin , left abdomen above the lat waistline DYSPLASTIC JUNCTIONAL NEVUS WITH SEVERE ATYPIA, AND HALO NEVUS EFFECT, CLOSE TO MARGIN  SEVERE Dysplastic Schedule surgery

## 2020-05-31 NOTE — Telephone Encounter (Signed)
Patient informed of pathology results and surgery scheduled. 

## 2020-07-11 ENCOUNTER — Ambulatory Visit (INDEPENDENT_AMBULATORY_CARE_PROVIDER_SITE_OTHER): Payer: Medicare Other | Admitting: Dermatology

## 2020-07-11 ENCOUNTER — Other Ambulatory Visit: Payer: Self-pay

## 2020-07-11 DIAGNOSIS — D2262 Melanocytic nevi of left upper limb, including shoulder: Secondary | ICD-10-CM

## 2020-07-11 DIAGNOSIS — R21 Rash and other nonspecific skin eruption: Secondary | ICD-10-CM

## 2020-07-11 DIAGNOSIS — D239 Other benign neoplasm of skin, unspecified: Secondary | ICD-10-CM

## 2020-07-11 DIAGNOSIS — L237 Allergic contact dermatitis due to plants, except food: Secondary | ICD-10-CM

## 2020-07-11 MED ORDER — CLOBETASOL PROP EMOLLIENT BASE 0.05 % EX CREA: 1.0000 "application " | TOPICAL_CREAM | Freq: Two times a day (BID) | CUTANEOUS | 0 refills | Status: DC

## 2020-07-11 NOTE — Progress Notes (Deleted)
   Follow-Up Visit   Subjective  LUM STILLINGER is a 74 y.o. male who presents for the following: Rash (Poison ivy? x 3 weeks itchy at times. Treated with calamine lotion and Benadryl.).    The following portions of the chart were reviewed this encounter and updated as appropriate:      Review of Systems:  No other skin or systemic complaints except as noted in HPI or Assessment and Plan.  Objective  Well appearing patient in no apparent distress; mood and affect are within normal limits.  A focused examination was performed including right forearm. Relevant physical exam findings are noted in the Assessment and Plan.  Objective  Right Forearm: Erythematous patch  Objective  Left abdomen above lat waistline: Healing biopsy site   Assessment & Plan  Rash Right Forearm  Allergic Contact Dermatitis secondary to plant  Start Clobetasol cream bid - Avoid face, groin, underarms  Clobetasol Prop Emollient Base (CLOBETASOL PROPIONATE E) 0.05 % emollient cream - Right Forearm  Dysplastic nevus Left abdomen above lat waistline  Patient is scheduled for excision 07/25/2020.  Return for Follow up as scheduled.   I, [name], CMA, am acting as scribe for [name], MD .

## 2020-07-11 NOTE — Progress Notes (Signed)
   Follow-Up Visit   Subjective  Derek Dawson is a 74 y.o. male who presents for the following: Rash (Poison ivy? x 3 weeks itchy at times. Treated with calamine lotion and Benadryl.).  The following portions of the chart were reviewed this encounter and updated as appropriate:  Tobacco  Allergies  Meds  Problems  Med Hx  Surg Hx  Fam Hx     Review of Systems:  No other skin or systemic complaints except as noted in HPI or Assessment and Plan.  Objective  Well appearing patient in no apparent distress; mood and affect are within normal limits.  A focused examination was performed including right forearm. Relevant physical exam findings are noted in the Assessment and Plan.  Objective  Right Forearm: Erythematous patch  Objective  Left abdomen above lat waistline: Healing biopsy site   Assessment & Plan  Rash Right Forearm Allergic Contact Dermatitis secondary to plant Start Clobetasol cream bid - Avoid face, groin, underarms  Clobetasol Prop Emollient Base (CLOBETASOL PROPIONATE E) 0.05 % emollient cream - Right Forearm  Dysplastic nevus Left abdomen above lat waistline Patient is scheduled for excision 07/25/2020.  Return for Follow up as scheduled.  Documentation: I have reviewed the above documentation for accuracy and completeness, and I agree with the above.  [name], MD

## 2020-07-12 ENCOUNTER — Encounter: Payer: Self-pay | Admitting: Dermatology

## 2020-07-25 ENCOUNTER — Telehealth: Payer: Self-pay

## 2020-07-25 ENCOUNTER — Ambulatory Visit (INDEPENDENT_AMBULATORY_CARE_PROVIDER_SITE_OTHER): Payer: Medicare Other | Admitting: Dermatology

## 2020-07-25 ENCOUNTER — Encounter: Payer: Self-pay | Admitting: Dermatology

## 2020-07-25 ENCOUNTER — Other Ambulatory Visit: Payer: Self-pay

## 2020-07-25 DIAGNOSIS — Z872 Personal history of diseases of the skin and subcutaneous tissue: Secondary | ICD-10-CM | POA: Diagnosis not present

## 2020-07-25 DIAGNOSIS — R21 Rash and other nonspecific skin eruption: Secondary | ICD-10-CM | POA: Diagnosis not present

## 2020-07-25 DIAGNOSIS — D485 Neoplasm of uncertain behavior of skin: Secondary | ICD-10-CM

## 2020-07-25 DIAGNOSIS — D225 Melanocytic nevi of trunk: Secondary | ICD-10-CM | POA: Diagnosis not present

## 2020-07-25 MED ORDER — MUPIROCIN 2 % EX OINT: 1.0000 "application " | TOPICAL_OINTMENT | Freq: Every day | CUTANEOUS | 0 refills | Status: DC

## 2020-07-25 NOTE — Progress Notes (Signed)
   Follow-Up Visit   Subjective  Derek Dawson is a 74 y.o. male who presents for the following: Procedure (Biopsy proven severe dysplastic nevus of left abdomen above the lat waistline - excise today).  The following portions of the chart were reviewed this encounter and updated as appropriate:  Tobacco  Allergies  Meds  Problems  Med Hx  Surg Hx  Fam Hx     Review of Systems:  No other skin or systemic complaints except as noted in HPI or Assessment and Plan.  Objective  Well appearing patient in no apparent distress; mood and affect are within normal limits.  A focused examination was performed including abdomen, right forearm. Relevant physical exam findings are noted in the Assessment and Plan.  Objective  Left abdomen above lat waistline: Healing biopsy site   Assessment & Plan  Neoplasm of uncertain behavior of skin Left abdomen above lat waistline  Skin excision  Lesion length (cm):  1.1 Lesion width (cm):  0.8 Margin per side (cm):  0.2 Total excision diameter (cm):  1.5 Informed consent: discussed and consent obtained   Timeout: patient name, date of birth, surgical site, and procedure verified   Procedure prep:  Patient was prepped and draped in usual sterile fashion Prep type:  Isopropyl alcohol and povidone-iodine Anesthesia: the lesion was anesthetized in a standard fashion   Anesthetic:  1% lidocaine w/ epinephrine 1-100,000 buffered w/ 8.4% NaHCO3 Instrument used: #15 blade   Hemostasis achieved with: pressure   Hemostasis achieved with comment:  Electrocautery Outcome: patient tolerated procedure well with no complications   Post-procedure details: sterile dressing applied and wound care instructions given   Dressing type: bandage and pressure dressing (mupirocin)    Skin repair Complexity:  Complex Final length (cm):  4 Reason for type of repair: reduce tension to allow closure, reduce the risk of dehiscence, infection, and necrosis, reduce  subcutaneous dead space and avoid a hematoma, allow closure of the large defect, preserve normal anatomy, preserve normal anatomical and functional relationships and enhance both functionality and cosmetic results   Undermining: area extensively undermined   Undermining comment:  Undermining defect 1.2 cm Subcutaneous layers (deep stitches):  Suture size:  2-0 Suture type: Vicryl (polyglactin 910)   Subcutaneous suture technique: inverted dermal. Fine/surface layer approximation (top stitches):  Suture size:  3-0 Suture type: nylon   Stitches: simple running   Suture removal (days):  7 Hemostasis achieved with: suture and pressure Outcome: patient tolerated procedure well with no complications   Post-procedure details: sterile dressing applied and wound care instructions given   Dressing type: bandage and pressure dressing (mupirocin)    mupirocin ointment (BACTROBAN) 2 %  Specimen 1 - Surgical pathology Differential Diagnosis: Biopsy proven severe dysplastic nevus Check Margins: No Healing biopsy site [phone]  Doxycycline 100 mg 1 po bid with food and plenty of fluid x 5 days - samples given Doryx 200mg  Lot [id number] 04/2022  Rash Right Forearm - Anterior  Allergic contact derm secondary to plant - Resolved  Return in about 1 week (around 08/01/2020) for suture removal.  I, Derek Dawson, CMA, am acting as scribe for [name], MD .  Documentation: I have reviewed the above documentation for accuracy and completeness, and I agree with the above.  [name], MD

## 2020-07-25 NOTE — Telephone Encounter (Signed)
Spoke with patient regarding surgery. He is doing fine/hd  

## 2020-07-25 NOTE — Patient Instructions (Signed)

## 2020-08-01 ENCOUNTER — Ambulatory Visit (INDEPENDENT_AMBULATORY_CARE_PROVIDER_SITE_OTHER): Payer: Medicare Other | Admitting: Dermatology

## 2020-08-01 ENCOUNTER — Other Ambulatory Visit: Payer: Self-pay

## 2020-08-01 DIAGNOSIS — D235 Other benign neoplasm of skin of trunk: Secondary | ICD-10-CM

## 2020-08-01 DIAGNOSIS — D239 Other benign neoplasm of skin, unspecified: Secondary | ICD-10-CM

## 2020-08-01 DIAGNOSIS — Z4802 Encounter for removal of sutures: Secondary | ICD-10-CM

## 2020-08-01 NOTE — Progress Notes (Signed)
   Follow-Up Visit   Subjective  Derek Dawson is a 74 y.o. male who presents for the following: Dysplastic nevus margins free, bx proven ( left abdomen above lat waistline, pt presents for suture removal).  The following portions of the chart were reviewed this encounter and updated as appropriate:  Tobacco  Allergies  Meds  Problems  Med Hx  Surg Hx  Fam Hx     Review of Systems:  No other skin or systemic complaints except as noted in HPI or Assessment and Plan.  Objective  Well appearing patient in no apparent distress; mood and affect are within normal limits.  A focused examination was performed including trunk. Relevant physical exam findings are noted in the Assessment and Plan.  Objective  Left abdomen above waistline: Healing excision site   Assessment & Plan  Dysplastic nevus Left abdomen above waistline  Margins free, bx proven  Healing excision site  Wound cleansed, sutures removed, wound cleansed and steri strips applied. Discussed pathology results.   Return for as scheduled 11/29/20 for 47m f/u.  Luther Redo, CMA, am acting as scribe for [name], MD .  Documentation: I have reviewed the above documentation for accuracy and completeness, and I agree with the above.  [name], MD

## 2020-08-04 ENCOUNTER — Encounter: Payer: Self-pay | Admitting: Dermatology

## 2020-09-28 ENCOUNTER — Other Ambulatory Visit: Payer: Medicare Other

## 2020-10-02 ENCOUNTER — Ambulatory Visit: Admission: RE | Admit: 2020-10-02 | Payer: Medicare Other | Source: Home / Self Care

## 2020-10-02 ENCOUNTER — Encounter: Admission: RE | Payer: Self-pay | Source: Home / Self Care

## 2020-10-02 SURGERY — COLONOSCOPY
Anesthesia: General

## 2020-11-29 ENCOUNTER — Ambulatory Visit: Payer: Medicare Other | Admitting: Dermatology

## 2021-01-26 ENCOUNTER — Other Ambulatory Visit (HOSPITAL_COMMUNITY): Payer: Self-pay | Admitting: Otolaryngology

## 2021-01-26 ENCOUNTER — Other Ambulatory Visit: Payer: Self-pay | Admitting: Otolaryngology

## 2021-01-26 DIAGNOSIS — H95132 Mucosal cyst of postmastoidectomy cavity, left ear: Secondary | ICD-10-CM

## 2021-01-26 DIAGNOSIS — D2322 Other benign neoplasm of skin of left ear and external auricular canal: Secondary | ICD-10-CM

## 2021-02-01 ENCOUNTER — Ambulatory Visit (INDEPENDENT_AMBULATORY_CARE_PROVIDER_SITE_OTHER): Payer: Medicare Other | Admitting: Dermatology

## 2021-02-01 ENCOUNTER — Encounter: Payer: Self-pay | Admitting: Dermatology

## 2021-02-01 ENCOUNTER — Other Ambulatory Visit: Payer: Self-pay

## 2021-02-01 DIAGNOSIS — Z1283 Encounter for screening for malignant neoplasm of skin: Secondary | ICD-10-CM | POA: Diagnosis not present

## 2021-02-01 DIAGNOSIS — Z86018 Personal history of other benign neoplasm: Secondary | ICD-10-CM

## 2021-02-01 DIAGNOSIS — L578 Other skin changes due to chronic exposure to nonionizing radiation: Secondary | ICD-10-CM | POA: Diagnosis not present

## 2021-02-01 DIAGNOSIS — D229 Melanocytic nevi, unspecified: Secondary | ICD-10-CM

## 2021-02-01 DIAGNOSIS — L720 Epidermal cyst: Secondary | ICD-10-CM

## 2021-02-01 DIAGNOSIS — L821 Other seborrheic keratosis: Secondary | ICD-10-CM

## 2021-02-01 DIAGNOSIS — L814 Other melanin hyperpigmentation: Secondary | ICD-10-CM

## 2021-02-01 DIAGNOSIS — D1800 Hemangioma unspecified site: Secondary | ICD-10-CM

## 2021-02-01 NOTE — Patient Instructions (Signed)

## 2021-02-01 NOTE — Progress Notes (Signed)
   Follow-Up Visit   Subjective  Derek Dawson is a 75 y.o. male who presents for the following: UBSE (Hx dysplastic nevi ). The patient presents for Upper Body Skin Exam (UBSE) for skin cancer screening and mole check.  The following portions of the chart were reviewed this encounter and updated as appropriate:   Tobacco  Allergies  Meds  Problems  Med Hx  Surg Hx  Fam Hx     Review of Systems:  No other skin or systemic complaints except as noted in HPI or Assessment and Plan.  Objective  Well appearing patient in no apparent distress; mood and affect are within normal limits.  A focused examination was performed including all skin from the waist up and the lower legs . Relevant physical exam findings are noted in the Assessment and Plan.  Objective  Upper back spinal: 0.7 cm firm SQ nodule   Assessment & Plan  Epidermal inclusion cyst Upper back spinal  Benign-appearing.  Observation.  Call clinic for new or changing lesions.  Recommend daily use of broad spectrum spf 30+ sunscreen to sun-exposed areas.    Lentigines - Scattered tan macules - Due to sun exposure - Benign-appering, observe - Recommend daily broad spectrum sunscreen SPF 30+ to sun-exposed areas, reapply every 2 hours as needed. - Call for any changes  Seborrheic Keratoses - Stuck-on, waxy, tan-brown papules and/or plaques  - Benign-appearing - Discussed benign etiology and prognosis. - Observe - Call for any changes  Melanocytic Nevi - Tan-brown and/or pink-flesh-colored symmetric macules and papules - Benign appearing on exam today - Observation - Call clinic for new or changing moles - Recommend daily use of broad spectrum spf 30+ sunscreen to sun-exposed areas.   Hemangiomas - Red papules - Discussed benign nature - Observe - Call for any changes  Actinic Damage - Chronic condition, secondary to cumulative UV/sun exposure - diffuse scaly erythematous macules with underlying  dyspigmentation - Recommend daily broad spectrum sunscreen SPF 30+ to sun-exposed areas, reapply every 2 hours as needed.  - Staying in the shade or wearing long sleeves, sun glasses (UVA+UVB protection) and wide brim hats (4-inch brim around the entire circumference of the hat) are also recommended for sun protection.  - Call for new or changing lesions.  History of Dysplastic Nevi - No evidence of recurrence today - Recommend regular full body skin exams - Recommend daily broad spectrum sunscreen SPF 30+ to sun-exposed areas, reapply every 2 hours as needed.  - Call if any new or changing lesions are noted between office visits  Return in about 6 months (around 08/04/2021) for TBSE - hx dysplastic nevi .  Luther Redo, CMA, am acting as scribe for [name], MD .  Documentation: I have reviewed the above documentation for accuracy and completeness, and I agree with the above.  [name], MD

## 2021-02-09 ENCOUNTER — Other Ambulatory Visit: Payer: Self-pay

## 2021-02-09 ENCOUNTER — Encounter: Payer: Self-pay | Admitting: Dermatology

## 2021-02-09 ENCOUNTER — Ambulatory Visit
Admission: RE | Admit: 2021-02-09 | Discharge: 2021-02-09 | Disposition: A | Discharge status: Home / Self Care | Payer: Medicare Other | Source: Ambulatory Visit | Attending: Otolaryngology | Admitting: Otolaryngology

## 2021-02-09 DIAGNOSIS — D2322 Other benign neoplasm of skin of left ear and external auricular canal: Secondary | ICD-10-CM | POA: Insufficient documentation

## 2021-02-13 ENCOUNTER — Other Ambulatory Visit: Payer: Self-pay | Admitting: Physical Medicine & Rehabilitation

## 2021-02-13 DIAGNOSIS — M542 Cervicalgia: Secondary | ICD-10-CM

## 2021-02-27 ENCOUNTER — Other Ambulatory Visit: Payer: Self-pay

## 2021-02-27 ENCOUNTER — Ambulatory Visit
Admission: RE | Admit: 2021-02-27 | Discharge: 2021-02-27 | Disposition: A | Discharge status: Home / Self Care | Payer: Medicare Other | Source: Ambulatory Visit | Attending: Physical Medicine & Rehabilitation | Admitting: Physical Medicine & Rehabilitation

## 2021-02-27 DIAGNOSIS — M542 Cervicalgia: Secondary | ICD-10-CM

## 2021-07-25 ENCOUNTER — Other Ambulatory Visit: Payer: Self-pay

## 2021-07-25 ENCOUNTER — Ambulatory Visit (INDEPENDENT_AMBULATORY_CARE_PROVIDER_SITE_OTHER): Payer: Medicare Other | Admitting: Dermatology

## 2021-07-25 DIAGNOSIS — L720 Epidermal cyst: Secondary | ICD-10-CM | POA: Diagnosis not present

## 2021-07-25 DIAGNOSIS — L821 Other seborrheic keratosis: Secondary | ICD-10-CM

## 2021-07-25 DIAGNOSIS — L578 Other skin changes due to chronic exposure to nonionizing radiation: Secondary | ICD-10-CM | POA: Diagnosis not present

## 2021-07-25 DIAGNOSIS — Z1283 Encounter for screening for malignant neoplasm of skin: Secondary | ICD-10-CM | POA: Diagnosis not present

## 2021-07-25 DIAGNOSIS — D229 Melanocytic nevi, unspecified: Secondary | ICD-10-CM

## 2021-07-25 DIAGNOSIS — D1800 Hemangioma unspecified site: Secondary | ICD-10-CM

## 2021-07-25 DIAGNOSIS — Z86018 Personal history of other benign neoplasm: Secondary | ICD-10-CM | POA: Diagnosis not present

## 2021-07-25 DIAGNOSIS — L814 Other melanin hyperpigmentation: Secondary | ICD-10-CM

## 2021-07-25 NOTE — Progress Notes (Signed)
   Follow-Up Visit   Subjective  Derek Dawson is a 75 y.o. male who presents for the following: Annual Exam (Yearly mole check ). Hx of Dysplastic nevus. The patient presents for Total-Body Skin Exam (TBSE) for skin cancer screening and mole check.   The following portions of the chart were reviewed this encounter and updated as appropriate:   Tobacco  Allergies  Meds  Problems  Med Hx  Surg Hx  Fam Hx     Review of Systems:  No other skin or systemic complaints except as noted in HPI or Assessment and Plan.  Objective  Well appearing patient in no apparent distress; mood and affect are within normal limits.  A full examination was performed including scalp, head, eyes, ears, nose, lips, neck, chest, axillae, abdomen, back, buttocks, bilateral upper extremities, bilateral lower extremities, hands, feet, fingers, toes, fingernails, and toenails. All findings within normal limits unless otherwise noted below.  upper back spinal 1.0 cm Subcutaneous nodule.    Assessment & Plan  Epidermal inclusion cyst upper back spinal  Benign-appearing. Exam most consistent with an epidermal inclusion cyst. Discussed that a cyst is a benign growth that can grow over time and sometimes get irritated or inflamed. Recommend observation if it is not bothersome. Discussed option of surgical excision to remove it if it is growing, symptomatic, or other changes noted. Please call for new or changing lesions so they can be evaluated.    Skin cancer screening   Lentigines - Scattered tan macules - Due to sun exposure - Benign-appearing, observe - Recommend daily broad spectrum sunscreen SPF 30+ to sun-exposed areas, reapply every 2 hours as needed. - Call for any changes  Seborrheic Keratoses - Stuck-on, waxy, tan-brown papules and/or plaques  - Benign-appearing - Discussed benign etiology and prognosis. - Observe - Call for any changes  Melanocytic Nevi - Tan-brown and/or  pink-flesh-colored symmetric macules and papules - Benign appearing on exam today - Observation - Call clinic for new or changing moles - Recommend daily use of broad spectrum spf 30+ sunscreen to sun-exposed areas.   Hemangiomas - Red papules - Discussed benign nature - Observe - Call for any changes  Actinic Damage - Chronic condition, secondary to cumulative UV/sun exposure - diffuse scaly erythematous macules with underlying dyspigmentation - Recommend daily broad spectrum sunscreen SPF 30+ to sun-exposed areas, reapply every 2 hours as needed.  - Staying in the shade or wearing long sleeves, sun glasses (UVA+UVB protection) and wide brim hats (4-inch brim around the entire circumference of the hat) are also recommended for sun protection.  - Call for new or changing lesions.  History of Dysplastic Nevi Left sacral paraspinal 2018 Right mid lateral pretibial 2020 Left abdomen above waistline 2021 - No evidence of recurrence today - Recommend regular full body skin exams - Recommend daily broad spectrum sunscreen SPF 30+ to sun-exposed areas, reapply every 2 hours as needed.  - Call if any new or changing lesions are noted between office visits   Skin cancer screening performed today.   Return in about 1 year (around 07/25/2022) for TBSE, hx of Dysplastic nevus .  IMarye Dawson, CMA, am acting as scribe for Derek Ser, MD .  Documentation: I have reviewed the above documentation for accuracy and completeness, and I agree with the above.  Derek Ser, MD

## 2021-07-25 NOTE — Patient Instructions (Signed)

## 2021-08-04 ENCOUNTER — Encounter: Payer: Self-pay | Admitting: Dermatology

## 2021-11-15 IMAGING — CT CT TEMPORAL BONES W/O CM
2 of 5 series · 14 of 40 positions shown, 17 images · non-contrast
Comparison: No pertinent prior exam.

CLINICAL DATA: Benign neoplasm of the skin in the left external
auditory canal.

EXAM:
CT TEMPORAL BONES WITHOUT CONTRAST
TECHNIQUE: Axial and coronal plane CT imaging of the petrous temporal bones was
performed with thin-collimation image reconstruction. No intravenous
contrast was administered. Multiplanar CT image reconstructions were
also generated.

[Series 8: ax mag right temperal bones 0.60 · axial · 0.20mm/px · z∈[-566,-506]mm · 11 of 121 slices shown, 14 images]
[im 11/121  brain]
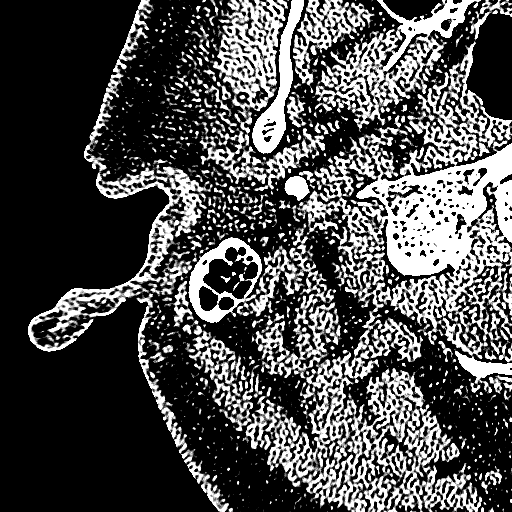
[im 11/121  bone]
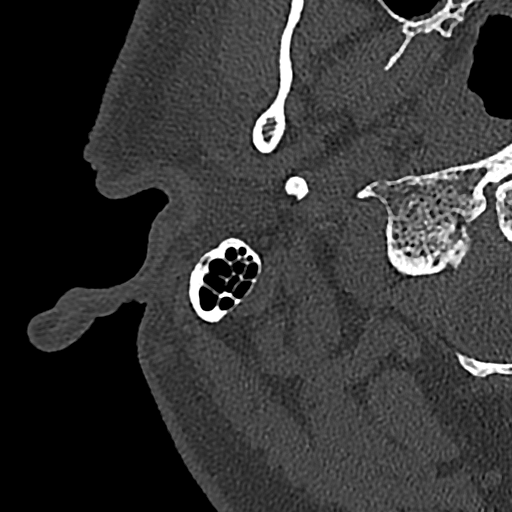
[im 21/121  bone]
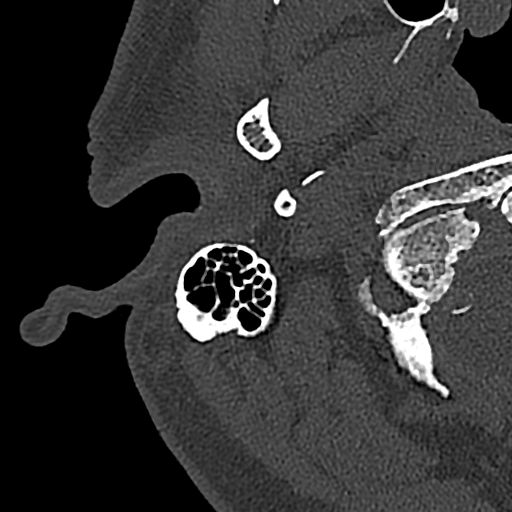
[im 31/121  bone]
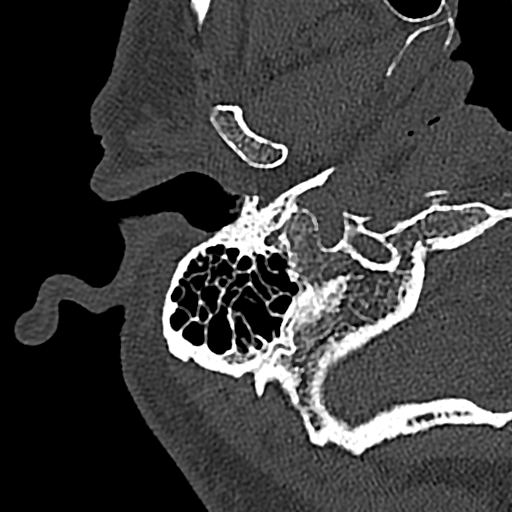
[im 41/121  bone]
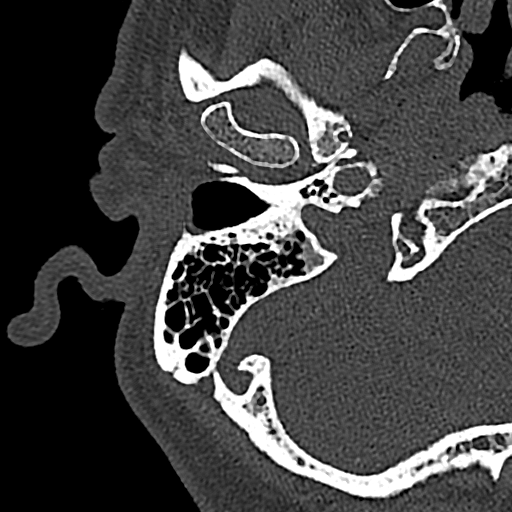
[im 51/121  brain]
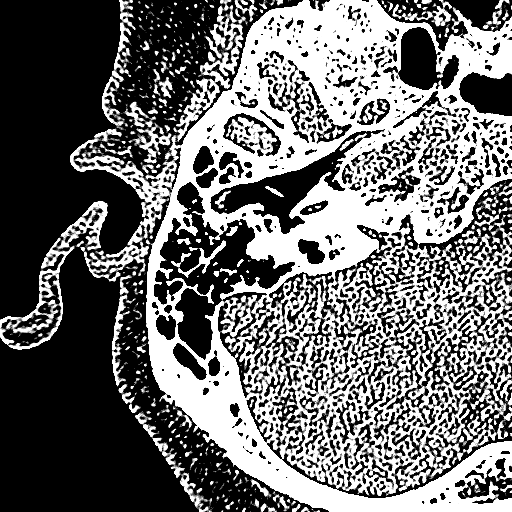
[im 51/121  bone]
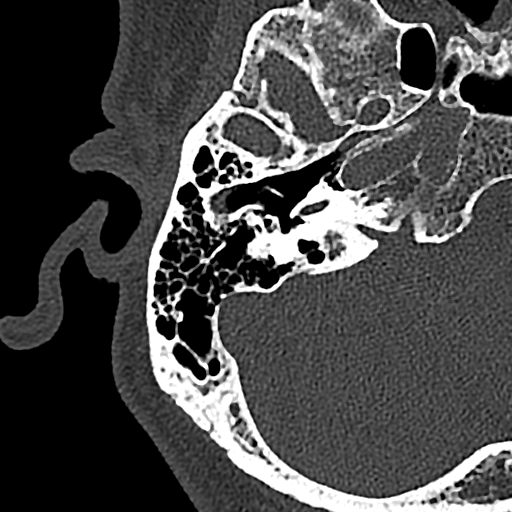
[im 61/121  bone]
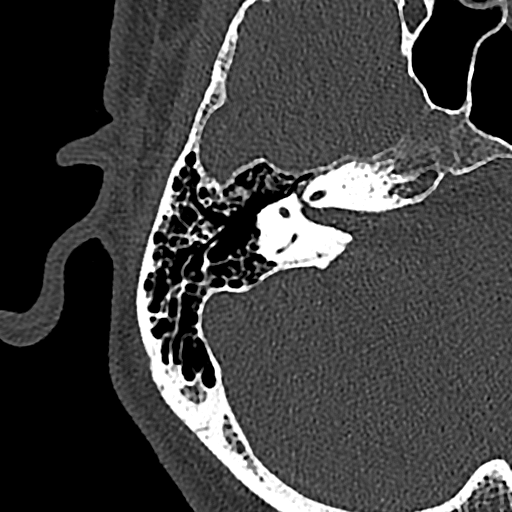
[im 71/121  bone]
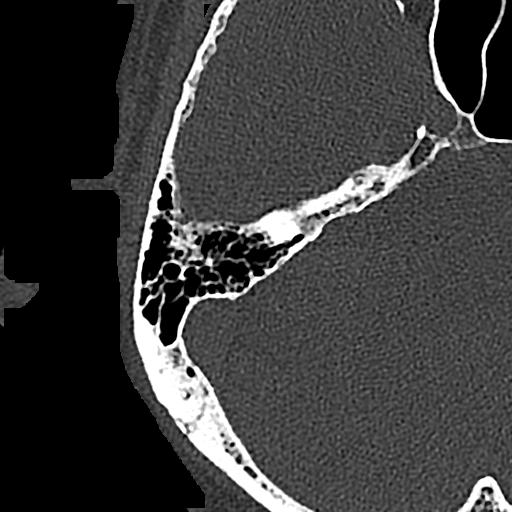
[im 81/121  bone]
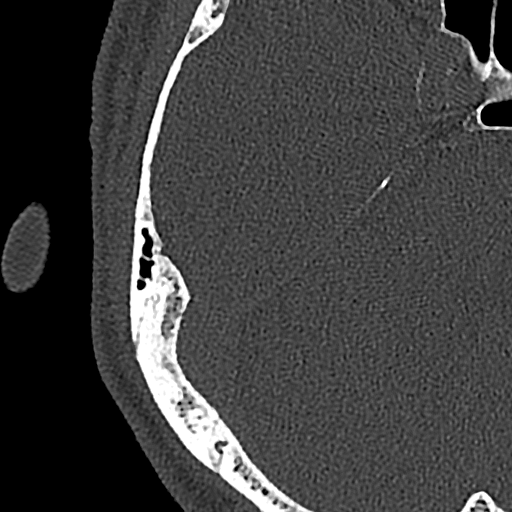
[im 91/121  brain]
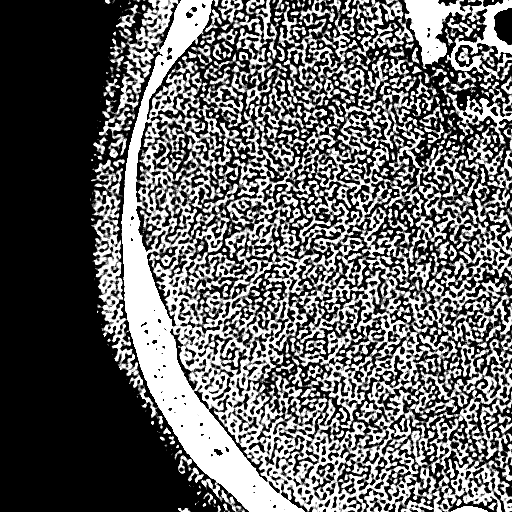
[im 91/121  bone]
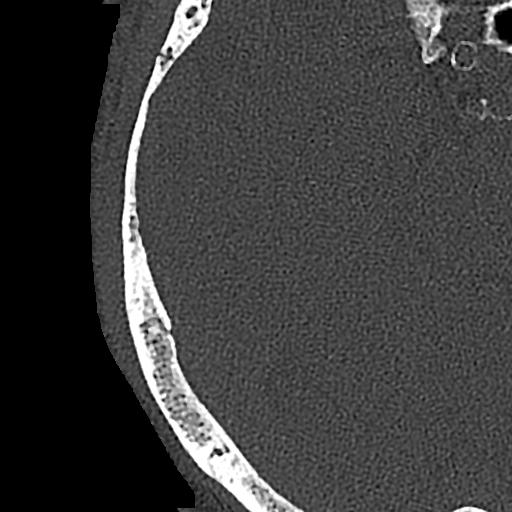
[im 101/121  bone]
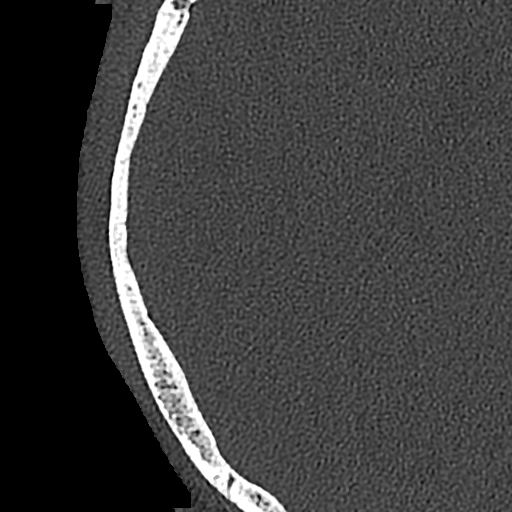
[im 111/121  bone]
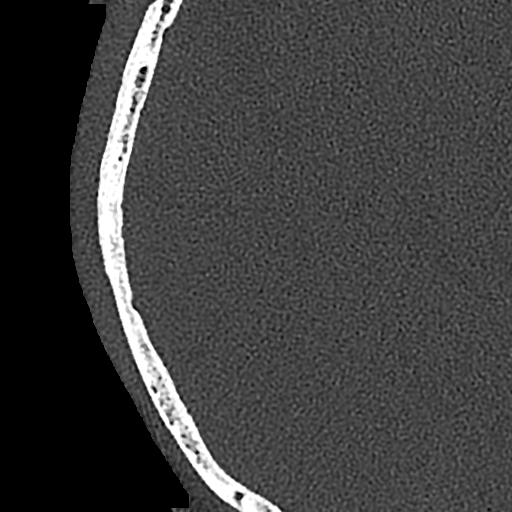

[Series 12: cor mag left temperal bones 0.80 cor · coronal · 0.14mm/px · 3 of 125 slices shown]
[im 25/125  bone]
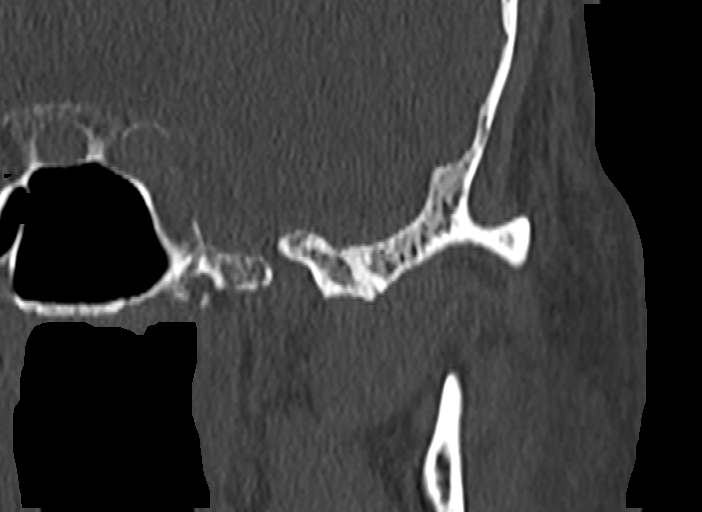
[im 50/125  bone]
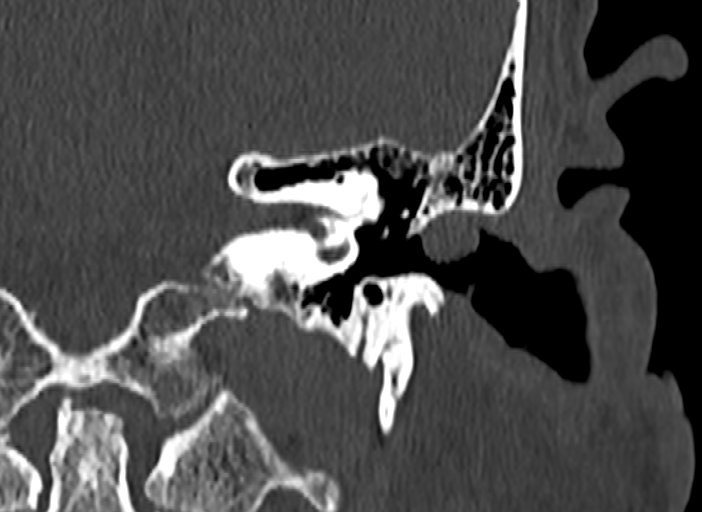
[im 75/125  bone]
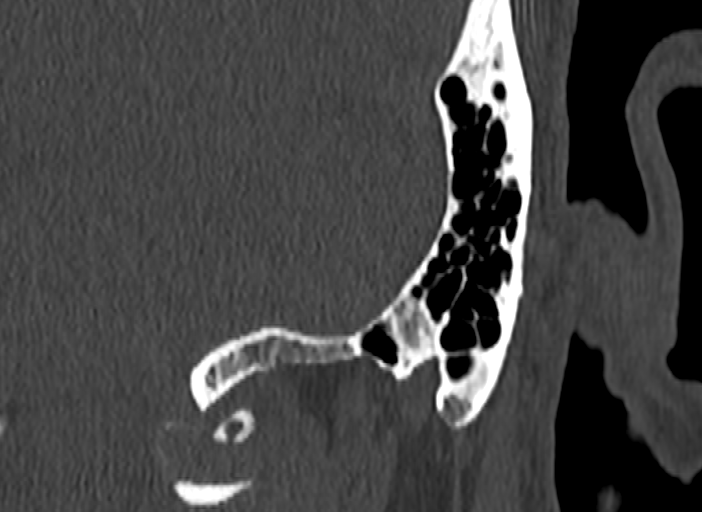

[14 of 40 positions shown; findings below may reference images not displayed]

FINDINGS: RIGHT TEMPORAL BONE

External auditory canal: Focal soft tissue irregularity is present
at the junction of the cartilaginous and osseous external auditory
canal inferiorly. No other focal lesions are present.

Middle ear cavity: Middle ear ossicles are normally formed and
articulating. Middle ear cavity and epitympanum are normal.

Inner ear structures: Cochlear at and vestibular apparatus are
normal. Semicircular canals are covered. Facial nerve is covered.

Internal auditory and facial nerve canals: Internal auditory canal
disease tubular aqueduct normal.

Mastoid air cells: Clear

LEFT TEMPORAL BONE

External auditory canal: There is diffuse soft tissue thickening of
the left external auditory canal. Benign soft tissue mass along the
superior aspect of the canal measures up to 8 mm. There is no
osseous destruction.

Middle ear cavity: Tympanic membrane is visualized and within normal
limits. The middle ear ossicles are normally formed and
articulating. Oval window is patent.

Inner ear structures: Cochlear and vestibular apparatus are within
normal limits. The semicircular canals are covered. Facial nerve
canal is within normal limits.

Internal auditory and facial nerve canals: The internal auditory
canal and vestibular aqueduct are within normal limits.

Mastoid air cells: Minimal fluid is present in the inferior mastoid
air cells. No obstructing lesion is present.

Vascular: Atherosclerotic calcifications are present within the
cavernous internal carotid arteries. No aberrant vessels are
present.

Limited intracranial:  Within normal limits.

Visible orbits/paranasal sinuses: Scattered mucosal thickening is
present throughout the ethmoid air cells. There is mucosal
thickening in the inferior left frontal sinus. Paranasal sinuses are
otherwise clear. Bilateral lens replacements are present. Globes and
orbits are otherwise within normal limits.

Soft tissues: Soft tissues the face visualized pinna are normal
bilaterally.
IMPRESSION: 1. Benign soft tissue mass along the superior aspect of the left
external auditory canal measures up to 8 mm. This likely represents
a benign cyst or obstructed sebaceous duct.
2. Focal soft tissue irregularity at the junction of the
cartilaginous and osseous external auditory canal inferiorly on the
right. This could be posttraumatic. Focal inflammation also
considered. The area should be amenable to direct visualization.
3. Fluid in the inferior left mastoid air cells without obstructing
lesion.
4. Normal appearance of the middle ear ossicles bilaterally.
5. Atherosclerosis.

## 2022-01-14 ENCOUNTER — Ambulatory Visit (INDEPENDENT_AMBULATORY_CARE_PROVIDER_SITE_OTHER): Payer: Medicare Other | Admitting: Dermatology

## 2022-01-14 DIAGNOSIS — L821 Other seborrheic keratosis: Secondary | ICD-10-CM | POA: Diagnosis not present

## 2022-01-14 DIAGNOSIS — L570 Actinic keratosis: Secondary | ICD-10-CM | POA: Diagnosis not present

## 2022-01-14 DIAGNOSIS — L578 Other skin changes due to chronic exposure to nonionizing radiation: Secondary | ICD-10-CM | POA: Diagnosis not present

## 2022-01-14 DIAGNOSIS — L820 Inflamed seborrheic keratosis: Secondary | ICD-10-CM

## 2022-01-14 NOTE — Patient Instructions (Signed)

## 2022-01-14 NOTE — Progress Notes (Signed)
? ?  Follow-Up Visit ?  ?Subjective  ?Derek Dawson is a 76 y.o. male who presents for the following: Other (Spot of left shoulder that does not look right). ?The patient has spots, moles and lesions to be evaluated, some may be new or changing and the patient has concerns that these could be cancer. ? ?The following portions of the chart were reviewed this encounter and updated as appropriate:  ? Tobacco  Allergies  Meds  Problems  Med Hx  Surg Hx  Fam Hx   ?  ?Review of Systems:  No other skin or systemic complaints except as noted in HPI or Assessment and Plan. ? ?Objective  ?Well appearing patient in no apparent distress; mood and affect are within normal limits. ? ?A focused examination was performed including left shoulder. Relevant physical exam findings are noted in the Assessment and Plan. ? ?Left top of shoulder ?Erythematous stuck-on, waxy papule or plaque ? ?Left infraorbital ?Cutaneous horn ? ? ?Assessment & Plan  ? ?Actinic Damage ?- chronic, secondary to cumulative UV radiation exposure/sun exposure over time ?- diffuse scaly erythematous macules with underlying dyspigmentation ?- Recommend daily broad spectrum sunscreen SPF 30+ to sun-exposed areas, reapply every 2 hours as needed.  ?- Recommend staying in the shade or wearing long sleeves, sun glasses (UVA+UVB protection) and wide brim hats (4-inch brim around the entire circumference of the hat). ?- Call for new or changing lesions. ? ?Seborrheic Keratoses ?- Stuck-on, waxy, tan-brown papules and/or plaques  ?- Benign-appearing ?- Discussed benign etiology and prognosis. ?- Observe ?- Call for any changes ? ?Inflamed seborrheic keratosis ?Left top of shoulder ? ?Destruction of lesion - Left top of shoulder ?Complexity: simple   ?Destruction method: cryotherapy   ?Informed consent: discussed and consent obtained   ?Timeout:  patient name, date of birth, surgical site, and procedure verified ?Lesion destroyed using liquid nitrogen: Yes    ?Region frozen until ice ball extended beyond lesion: Yes   ?Outcome: patient tolerated procedure well with no complications   ?Post-procedure details: wound care instructions given   ? ?AK (actinic keratosis) ?Left infraorbital ? ?Hypertrophic AK ? ?Destruction of lesion - Left infraorbital ?Complexity: simple   ?Destruction method: cryotherapy   ?Informed consent: discussed and consent obtained   ?Timeout:  patient name, date of birth, surgical site, and procedure verified ?Lesion destroyed using liquid nitrogen: Yes   ?Region frozen until ice ball extended beyond lesion: Yes   ?Outcome: patient tolerated procedure well with no complications   ?Post-procedure details: wound care instructions given   ? ? ?Return for Follow up as scheduled. ? ?I, [name], CMA, am acting as scribe for [name], MD . ?Documentation: I have reviewed the above documentation for accuracy and completeness, and I agree with the above. ? ?[name], MD ? ?

## 2022-01-22 ENCOUNTER — Encounter: Payer: Self-pay | Admitting: Dermatology

## 2022-07-25 ENCOUNTER — Ambulatory Visit (INDEPENDENT_AMBULATORY_CARE_PROVIDER_SITE_OTHER): Payer: Medicare Other | Admitting: Dermatology

## 2022-07-25 DIAGNOSIS — Z86006 Personal history of melanoma in-situ: Secondary | ICD-10-CM | POA: Diagnosis not present

## 2022-07-25 DIAGNOSIS — Z86018 Personal history of other benign neoplasm: Secondary | ICD-10-CM

## 2022-07-25 DIAGNOSIS — Z1283 Encounter for screening for malignant neoplasm of skin: Secondary | ICD-10-CM

## 2022-07-25 DIAGNOSIS — L720 Epidermal cyst: Secondary | ICD-10-CM

## 2022-07-25 DIAGNOSIS — L821 Other seborrheic keratosis: Secondary | ICD-10-CM

## 2022-07-25 DIAGNOSIS — L578 Other skin changes due to chronic exposure to nonionizing radiation: Secondary | ICD-10-CM

## 2022-07-25 DIAGNOSIS — L814 Other melanin hyperpigmentation: Secondary | ICD-10-CM

## 2022-07-25 DIAGNOSIS — Z85820 Personal history of malignant melanoma of skin: Secondary | ICD-10-CM

## 2022-07-25 DIAGNOSIS — D229 Melanocytic nevi, unspecified: Secondary | ICD-10-CM

## 2022-07-25 NOTE — Patient Instructions (Signed)
Due to recent changes in healthcare laws, you may see results of your pathology and/or laboratory studies on MyChart before the doctors have had a chance to review them. We understand that in some cases there may be results that are confusing or concerning to you. Please understand that not all results are received at the same time and often the doctors may need to interpret multiple results in order to provide you with the best plan of care or course of treatment. Therefore, we ask that you please give us 2 business days to thoroughly review all your results before contacting the office for clarification. Should we see a critical lab result, you will be contacted sooner.   If You Need Anything After Your Visit  If you have any questions or concerns for your doctor, please call our main line at [phone] and press option 4 to reach your doctor's medical assistant. If no one answers, please leave a voicemail as directed and we will return your call as soon as possible. Messages left after 4 pm will be answered the following business day.   You may also send us a message via MyChart. We typically respond to MyChart messages within 1-2 business days.  For prescription refills, please ask your pharmacy to contact our office. Our fax number is [phone].  If you have an urgent issue when the clinic is closed that cannot wait until the next business day, you can page your doctor at the number below.    Please note that while we do our best to be available for urgent issues outside of office hours, we are not available 24/7.   If you have an urgent issue and are unable to reach us, you may choose to seek medical care at your doctor's office, retail clinic, urgent care center, or emergency room.  If you have a medical emergency, please immediately call 911 or go to the emergency department.  Pager Numbers  - Dr. Kowalski: [phone]  - Dr. Moye: [phone]  - Dr. Stewart:  [phone]  In the event of inclement weather, please call our main line at [phone] for an update on the status of any delays or closures.  Dermatology Medication Tips: Please keep the boxes that topical medications come in in order to help keep track of the instructions about where and how to use these. Pharmacies typically print the medication instructions only on the boxes and not directly on the medication tubes.   If your medication is too expensive, please contact our office at [phone] option 4 or send us a message through MyChart.   We are unable to tell what your co-pay for medications will be in advance as this is different depending on your insurance coverage. However, we may be able to find a substitute medication at lower cost or fill out paperwork to get insurance to cover a needed medication.   If a prior authorization is required to get your medication covered by your insurance company, please allow us 1-2 business days to complete this process.  Drug prices often vary depending on where the prescription is filled and some pharmacies may offer cheaper prices.  The website www.goodrx.com contains coupons for medications through different pharmacies. The prices here do not account for what the cost may be with help from insurance (it may be cheaper with your insurance), but the website can give you the price if you did not use any insurance.  - You can print the associated coupon and take it with   your prescription to the pharmacy.  - You may also stop by our office during regular business hours and pick up a GoodRx coupon card.  - If you need your prescription sent electronically to a different pharmacy, notify our office through [facility] MyChart or by phone at [phone] option 4.     Si Usted Necesita Algo Despus de Su Visita  Tambin puede enviarnos un mensaje a travs de MyChart. Por lo general respondemos a los mensajes de MyChart en el transcurso de 1 a 2  das hbiles.  Para renovar recetas, por favor pida a su farmacia que se ponga en contacto con nuestra oficina. Nuestro nmero de fax es el [phone].  Si tiene un asunto urgente cuando la clnica est cerrada y que no puede esperar hasta el siguiente da hbil, puede llamar/localizar a su doctor(a) al nmero que aparece a continuacin.   Por favor, tenga en cuenta que aunque hacemos todo lo posible para estar disponibles para asuntos urgentes fuera del horario de oficina, no estamos disponibles las 24 horas del da, los 7 das de la semana.   Si tiene un problema urgente y no puede comunicarse con nosotros, puede optar por buscar atencin mdica  en el consultorio de su doctor(a), en una clnica privada, en un centro de atencin urgente o en una sala de emergencias.  Si tiene una emergencia mdica, por favor llame inmediatamente al 911 o vaya a la sala de emergencias.  Nmeros de bper  - Dr. Kowalski: [phone]  - Dra. Moye: [phone]  - Dra. Stewart: [phone]  En caso de inclemencias del tiempo, por favor llame a nuestra lnea principal al [phone] para una actualizacin sobre el estado de cualquier retraso o cierre.  Consejos para la medicacin en dermatologa: Por favor, guarde las cajas en las que vienen los medicamentos de uso tpico para ayudarle a seguir las instrucciones sobre dnde y cmo usarlos. Las farmacias generalmente imprimen las instrucciones del medicamento slo en las cajas y no directamente en los tubos del medicamento.   Si su medicamento es muy caro, por favor, pngase en contacto con nuestra oficina llamando al [phone] y presione la opcin 4 o envenos un mensaje a travs de MyChart.   No podemos decirle cul ser su copago por los medicamentos por adelantado ya que esto es diferente dependiendo de la cobertura de su seguro. Sin embargo, es posible que podamos encontrar un medicamento sustituto a menor costo o llenar un formulario para que el  seguro cubra el medicamento que se considera necesario.   Si se requiere una autorizacin previa para que su compaa de seguros cubra su medicamento, por favor permtanos de 1 a 2 das hbiles para completar este proceso.  Los precios de los medicamentos varan con frecuencia dependiendo del lugar de dnde se surte la receta y alguna farmacias pueden ofrecer precios ms baratos.  El sitio web www.goodrx.com tiene cupones para medicamentos de diferentes farmacias. Los precios aqu no tienen en cuenta lo que podra costar con la ayuda del seguro (puede ser ms barato con su seguro), pero el sitio web puede darle el precio si no utiliz ningn seguro.  - Puede imprimir el cupn correspondiente y llevarlo con su receta a la farmacia.  - Tambin puede pasar por nuestra oficina durante el horario de atencin regular y recoger una tarjeta de cupones de GoodRx.  - Si necesita que su receta se enve electrnicamente a una farmacia diferente, informe a nuestra oficina a travs de MyChart de [facility]   o por telfono llamando al [phone] y presione la opcin 4.  

## 2022-07-25 NOTE — Progress Notes (Signed)
Follow-Up Visit   Subjective  Derek Dawson is a 76 y.o. male who presents for the following: Annual Exam. The patient presents for Total-Body Skin Exam (TBSE) for skin cancer screening and mole check.  The patient has spots, moles and lesions to be evaluated, some may be new or changing and the patient has concerns that these could be cancer.   The following portions of the chart were reviewed this encounter and updated as appropriate:   Tobacco  Allergies  Meds  Problems  Med Hx  Surg Hx  Fam Hx     Review of Systems:  No other skin or systemic complaints except as noted in HPI or Assessment and Plan.  Objective  Well appearing patient in no apparent distress; mood and affect are within normal limits.  A full examination was performed including scalp, head, eyes, ears, nose, lips, neck, chest, axillae, abdomen, back, buttocks, bilateral upper extremities, bilateral lower extremities, hands, feet, fingers, toes, fingernails, and toenails. All findings within normal limits unless otherwise noted below.  Right Upper Back 1.2 cm Subcutaneous nodule.    Assessment & Plan  Epidermal inclusion cyst Right Upper Back  Benign-appearing. Exam most consistent with an epidermal inclusion cyst. Discussed that a cyst is a benign growth that can grow over time and sometimes get irritated or inflamed. Recommend observation if it is not bothersome. Discussed option of surgical excision to remove it if it is growing, symptomatic, or other changes noted. Please call for new or changing lesions so they can be evaluated.    Lentigines - Scattered tan macules - Due to sun exposure - Benign-appearing, observe - Recommend daily broad spectrum sunscreen SPF 30+ to sun-exposed areas, reapply every 2 hours as needed. - Call for any changes  Seborrheic Keratoses - Stuck-on, waxy, tan-brown papules and/or plaques  - Benign-appearing - Discussed benign etiology and prognosis. - Observe -  Call for any changes  Melanocytic Nevi - Tan-brown and/or pink-flesh-colored symmetric macules and papules - Benign appearing on exam today - Observation - Call clinic for new or changing moles - Recommend daily use of broad spectrum spf 30+ sunscreen to sun-exposed areas.   Hemangiomas - Red papules - Discussed benign nature - Observe - Call for any changes  Actinic Damage - Chronic condition, secondary to cumulative UV/sun exposure - diffuse scaly erythematous macules with underlying dyspigmentation - Recommend daily broad spectrum sunscreen SPF 30+ to sun-exposed areas, reapply every 2 hours as needed.  - Staying in the shade or wearing long sleeves, sun glasses (UVA+UVB protection) and wide brim hats (4-inch brim around the entire circumference of the hat) are also recommended for sun protection.  - Call for new or changing lesions.  History of Dysplastic Nevi Multiple see history  - No evidence of recurrence today - Recommend regular full body skin exams - Recommend daily broad spectrum sunscreen SPF 30+ to sun-exposed areas, reapply every 2 hours as needed.  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  - Call if any new or changing lesions are noted between office visits   Skin cancer screening performed today.   Return in about 6 months (around 01/23/2023) for TBSE, hx of Melanoma in  situ.  I, [name], CMA, am acting as scribe for [name], MD .  Documentation: I have reviewed the above documentation for accuracy and completeness, and I agree with the above.  [name], MD

## 2022-08-03 ENCOUNTER — Encounter: Payer: Self-pay | Admitting: Dermatology

## 2022-08-07 ENCOUNTER — Other Ambulatory Visit: Payer: Self-pay | Admitting: Orthopedic Surgery

## 2022-08-07 DIAGNOSIS — M7521 Bicipital tendinitis, right shoulder: Secondary | ICD-10-CM

## 2022-08-07 DIAGNOSIS — M25521 Pain in right elbow: Secondary | ICD-10-CM

## 2022-08-12 ENCOUNTER — Ambulatory Visit
Admission: RE | Admit: 2022-08-12 | Discharge: 2022-08-12 | Disposition: A | Discharge status: Home / Self Care | Payer: Medicare Other | Source: Ambulatory Visit | Attending: Orthopedic Surgery | Admitting: Orthopedic Surgery

## 2022-08-12 DIAGNOSIS — M25521 Pain in right elbow: Secondary | ICD-10-CM

## 2022-08-12 DIAGNOSIS — M7521 Bicipital tendinitis, right shoulder: Secondary | ICD-10-CM

## 2023-01-23 ENCOUNTER — Ambulatory Visit: Payer: Medicare Other | Admitting: Dermatology

## 2023-02-21 ENCOUNTER — Emergency Department: Payer: Medicare Other

## 2023-02-21 ENCOUNTER — Inpatient Hospital Stay
Admission: EM | Admit: 2023-02-21 | Discharge: 2023-02-23 | DRG: 872 | Disposition: A | Discharge status: Home / Self Care | Payer: Medicare Other | Attending: Internal Medicine | Admitting: Internal Medicine

## 2023-02-21 ENCOUNTER — Encounter: Payer: Self-pay | Admitting: Family Medicine

## 2023-02-21 ENCOUNTER — Other Ambulatory Visit: Payer: Self-pay

## 2023-02-21 DIAGNOSIS — Z85820 Personal history of malignant melanoma of skin: Secondary | ICD-10-CM

## 2023-02-21 DIAGNOSIS — A040 Enteropathogenic Escherichia coli infection: Secondary | ICD-10-CM | POA: Diagnosis present

## 2023-02-21 DIAGNOSIS — A09 Infectious gastroenteritis and colitis, unspecified: Secondary | ICD-10-CM | POA: Diagnosis present

## 2023-02-21 DIAGNOSIS — B349 Viral infection, unspecified: Secondary | ICD-10-CM

## 2023-02-21 DIAGNOSIS — I10 Essential (primary) hypertension: Secondary | ICD-10-CM | POA: Diagnosis present

## 2023-02-21 DIAGNOSIS — E876 Hypokalemia: Secondary | ICD-10-CM | POA: Diagnosis present

## 2023-02-21 DIAGNOSIS — E860 Dehydration: Secondary | ICD-10-CM | POA: Diagnosis present

## 2023-02-21 DIAGNOSIS — E861 Hypovolemia: Secondary | ICD-10-CM | POA: Diagnosis present

## 2023-02-21 DIAGNOSIS — R197 Diarrhea, unspecified: Secondary | ICD-10-CM

## 2023-02-21 DIAGNOSIS — E872 Acidosis, unspecified: Secondary | ICD-10-CM | POA: Diagnosis present

## 2023-02-21 DIAGNOSIS — N179 Acute kidney failure, unspecified: Secondary | ICD-10-CM | POA: Diagnosis present

## 2023-02-21 DIAGNOSIS — A4151 Sepsis due to Escherichia coli [E. coli]: Secondary | ICD-10-CM | POA: Diagnosis not present

## 2023-02-21 DIAGNOSIS — R6520 Severe sepsis without septic shock: Secondary | ICD-10-CM | POA: Diagnosis present

## 2023-02-21 DIAGNOSIS — E663 Overweight: Secondary | ICD-10-CM | POA: Diagnosis present

## 2023-02-21 DIAGNOSIS — I447 Left bundle-branch block, unspecified: Secondary | ICD-10-CM | POA: Diagnosis present

## 2023-02-21 DIAGNOSIS — Z79899 Other long term (current) drug therapy: Secondary | ICD-10-CM

## 2023-02-21 DIAGNOSIS — A044 Other intestinal Escherichia coli infections: Secondary | ICD-10-CM | POA: Insufficient documentation

## 2023-02-21 DIAGNOSIS — E119 Type 2 diabetes mellitus without complications: Secondary | ICD-10-CM | POA: Diagnosis present

## 2023-02-21 DIAGNOSIS — Z1152 Encounter for screening for COVID-19: Secondary | ICD-10-CM

## 2023-02-21 DIAGNOSIS — K219 Gastro-esophageal reflux disease without esophagitis: Secondary | ICD-10-CM | POA: Diagnosis present

## 2023-02-21 DIAGNOSIS — E782 Mixed hyperlipidemia: Secondary | ICD-10-CM | POA: Diagnosis present

## 2023-02-21 DIAGNOSIS — A419 Sepsis, unspecified organism: Secondary | ICD-10-CM | POA: Insufficient documentation

## 2023-02-21 LAB — COMPREHENSIVE METABOLIC PANEL
ALT: 31 U/L (ref 0–44)
AST: 29 U/L (ref 15–41)
Albumin: 3.8 g/dL (ref 3.5–5.0)
Alkaline Phosphatase: 40 U/L (ref 38–126)
Anion gap: 15 (ref 5–15)
BUN: 46 mg/dL — ABNORMAL HIGH (ref 8–23)
CO2: 20 mmol/L — ABNORMAL LOW (ref 22–32)
Calcium: 8.2 mg/dL — ABNORMAL LOW (ref 8.9–10.3)
Chloride: 101 mmol/L (ref 98–111)
Creatinine, Ser: 2.54 mg/dL — ABNORMAL HIGH (ref 0.61–1.24)
GFR, Estimated: 26 mL/min — ABNORMAL LOW (ref >60)
Glucose, Bld: 190 mg/dL — ABNORMAL HIGH (ref 70–99)
Potassium: 3.4 mmol/L — ABNORMAL LOW (ref 3.5–5.1)
Sodium: 136 mmol/L (ref 135–145)
Total Bilirubin: 0.7 mg/dL (ref 0.3–1.2)
Total Protein: 7.4 g/dL (ref 6.5–8.1)

## 2023-02-21 LAB — LACTIC ACID, PLASMA
Lactic Acid, Venous: 1.9 mmol/L (ref 0.5–1.9)
Lactic Acid, Venous: 2.5 mmol/L (ref 0.5–1.9)
Lactic Acid, Venous: 3.7 mmol/L (ref 0.5–1.9)

## 2023-02-21 LAB — C DIFFICILE QUICK SCREEN W PCR REFLEX
C Diff antigen: NEGATIVE
C Diff interpretation: NOT DETECTED
C Diff toxin: NEGATIVE

## 2023-02-21 LAB — URINALYSIS, ROUTINE W REFLEX MICROSCOPIC
Bacteria, UA: NONE SEEN
Bilirubin Urine: NEGATIVE
Glucose, UA: NEGATIVE mg/dL
Ketones, ur: NEGATIVE mg/dL
Leukocytes,Ua: NEGATIVE
Nitrite: NEGATIVE
Protein, ur: 30 mg/dL — AB
Specific Gravity, Urine: 1.009 (ref 1.005–1.030)
pH: 5 (ref 5.0–8.0)

## 2023-02-21 LAB — LIPASE, BLOOD: Lipase: 44 U/L (ref 11–51)

## 2023-02-21 LAB — CBC
HCT: 44.3 % (ref 39.0–52.0)
HCT: 44.6 % (ref 39.0–52.0)
Hemoglobin: 14.9 g/dL (ref 13.0–17.0)
Hemoglobin: 15 g/dL (ref 13.0–17.0)
MCH: 31.8 pg (ref 26.0–34.0)
MCH: 32 pg (ref 26.0–34.0)
MCHC: 33.4 g/dL (ref 30.0–36.0)
MCHC: 33.9 g/dL (ref 30.0–36.0)
MCV: 94.5 fL (ref 80.0–100.0)
MCV: 95.1 fL (ref 80.0–100.0)
Platelets: 232 10*3/uL (ref 150–400)
Platelets: 236 10*3/uL (ref 150–400)
RBC: 4.69 MIL/uL (ref 4.22–5.81)
RBC: 4.69 MIL/uL (ref 4.22–5.81)
RDW: 12.6 % (ref 11.5–15.5)
RDW: 12.7 % (ref 11.5–15.5)
WBC: 17.2 10*3/uL — ABNORMAL HIGH (ref 4.0–10.5)
WBC: 17.4 10*3/uL — ABNORMAL HIGH (ref 4.0–10.5)
nRBC: 0 % (ref 0.0–0.2)
nRBC: 0 % (ref 0.0–0.2)

## 2023-02-21 LAB — GASTROINTESTINAL PANEL BY PCR, STOOL (REPLACES STOOL CULTURE)

## 2023-02-21 LAB — CBG MONITORING, ED: Glucose-Capillary: 132 mg/dL — ABNORMAL HIGH (ref 70–99)

## 2023-02-21 LAB — HEMOGLOBIN A1C
Hgb A1c MFr Bld: 6.7 % — ABNORMAL HIGH (ref 4.8–5.6)
Mean Plasma Glucose: 145.59 mg/dL

## 2023-02-21 LAB — SARS CORONAVIRUS 2 BY RT PCR: SARS Coronavirus 2 by RT PCR: NEGATIVE

## 2023-02-21 LAB — GLUCOSE, CAPILLARY: Glucose-Capillary: 138 mg/dL — ABNORMAL HIGH (ref 70–99)

## 2023-02-21 MED ORDER — ACETAMINOPHEN 650 MG RE SUPP: 650.0000 mg | Freq: Four times a day (QID) | RECTAL | Status: DC | PRN

## 2023-02-21 MED ORDER — PANTOPRAZOLE SODIUM 40 MG PO TBEC
40.0000 mg | DELAYED_RELEASE_TABLET | Freq: Every day | ORAL | Status: DC
  Administered 2023-02-21 – 2023-02-23 (×3): 40 mg via ORAL
  Filled 2023-02-21 (×3): qty 1

## 2023-02-21 MED ORDER — SODIUM CHLORIDE 0.9 % IV BOLUS
1000.0000 mL | Freq: Once | INTRAVENOUS | Status: AC
  Administered 2023-02-21: 1000 mL via INTRAVENOUS

## 2023-02-21 MED ORDER — METOPROLOL TARTRATE 5 MG/5ML IV SOLN: 5.0000 mg | Freq: Four times a day (QID) | INTRAVENOUS | Status: DC | PRN

## 2023-02-21 MED ORDER — POLYETHYLENE GLYCOL 3350 17 G PO PACK: 17.0000 g | PACK | Freq: Every day | ORAL | Status: DC | PRN

## 2023-02-21 MED ORDER — ONDANSETRON HCL 4 MG/2ML IJ SOLN: 4.0000 mg | Freq: Four times a day (QID) | INTRAMUSCULAR | Status: DC | PRN

## 2023-02-21 MED ORDER — INSULIN ASPART 100 UNIT/ML IJ SOLN
0.0000 [IU] | Freq: Three times a day (TID) | INTRAMUSCULAR | Status: DC
  Administered 2023-02-21 – 2023-02-22 (×2): 1 [IU] via SUBCUTANEOUS
  Filled 2023-02-21 (×2): qty 1

## 2023-02-21 MED ORDER — AMLODIPINE BESYLATE 5 MG PO TABS
5.0000 mg | ORAL_TABLET | Freq: Every day | ORAL | Status: DC
  Administered 2023-02-21 – 2023-02-23 (×3): 5 mg via ORAL
  Filled 2023-02-21 (×3): qty 1

## 2023-02-21 MED ORDER — ONDANSETRON HCL 4 MG PO TABS: 4.0000 mg | ORAL_TABLET | Freq: Four times a day (QID) | ORAL | Status: DC | PRN

## 2023-02-21 MED ORDER — LISINOPRIL 20 MG PO TABS
40.0000 mg | ORAL_TABLET | Freq: Every day | ORAL | Status: DC
  Administered 2023-02-21: 40 mg via ORAL
  Filled 2023-02-21: qty 4

## 2023-02-21 MED ORDER — LORATADINE 10 MG PO TABS
10.0000 mg | ORAL_TABLET | Freq: Every day | ORAL | Status: DC
  Administered 2023-02-21 – 2023-02-23 (×3): 10 mg via ORAL
  Filled 2023-02-21 (×3): qty 1

## 2023-02-21 MED ORDER — FENTANYL CITRATE PF 50 MCG/ML IJ SOSY
12.5000 ug | PREFILLED_SYRINGE | INTRAMUSCULAR | Status: DC | PRN
  Administered 2023-02-21 – 2023-02-22 (×2): 25 ug via INTRAVENOUS
  Filled 2023-02-21 (×2): qty 1

## 2023-02-21 MED ORDER — ACETAMINOPHEN 325 MG PO TABS
650.0000 mg | ORAL_TABLET | Freq: Four times a day (QID) | ORAL | Status: DC | PRN
  Administered 2023-02-21: 650 mg via ORAL
  Filled 2023-02-21: qty 2

## 2023-02-21 MED ORDER — PRAVASTATIN SODIUM 20 MG PO TABS
40.0000 mg | ORAL_TABLET | Freq: Every day | ORAL | Status: DC
  Administered 2023-02-21 – 2023-02-22 (×2): 40 mg via ORAL
  Filled 2023-02-21 (×2): qty 2

## 2023-02-21 NOTE — H&P (Signed)
History and Physical    Patient: Derek Dawson WJX:[id number] DOB: 06-Apr-1946 DOA: 02/21/2023 DOS: the patient was seen and examined on 02/21/2023 PCP: Dorothey Baseman, MD  Patient coming from: Home  Chief Complaint:  Chief Complaint  Patient presents with   Fever   HPI: Derek Dawson is a 77 y.o. male with medical history significant of  HTN, HLD, GERD who has returned from a cruise to the [place] with 5-day history of profuse watery diarrhea.  He denies nausea and vomiting.  He denies rectal bleeding.  In the ED was noted to have a lactic acid of 3.7 and a negative CT scan.  He was also noted to have acute kidney injury.  We are asked to admit for IV hydration as well as any other treatment needed for acute infectious diarrhea. Review of Systems: As mentioned in the history of present illness. All other systems reviewed and are negative. 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 Severe atypia and halo nevus effect, close to margin. Excised 07/25/2020, margins free.   Family history of adverse reaction to anesthesia    son - slow to wake   GERD (gastroesophageal reflux disease)    Hypertension    Melanoma (HCC) 12/31/2017   L low back 4.0 cm lat to spine - MMIS lentigo maligna type excision 01/13/2018   Pre-diabetes    Past Surgical History:  Procedure Laterality Date   BACK SURGERY     CATARACT EXTRACTION W/PHACO Left 09/25/2015   Procedure: CATARACT EXTRACTION PHACO AND INTRAOCULAR LENS PLACEMENT (IOC);  Surgeon: Sallee Lange, MD;  Location: [facility] ORS;  Service: Ophthalmology;  Laterality: Left;  [place] 01:28 AP% 25.5 CDE 37.96 fluid pack lot #[id number] [facility]   CATARACT EXTRACTION W/PHACO Right 04/27/2019   Procedure: CATARACT EXTRACTION  PHACO AND INTRAOCULAR LENS PLACEMENT (IOC) RIGHT DIABETES-DIET CONTROLLED;  Surgeon: Galen Manila, MD;  Location: [facility];  Service: Ophthalmology;  Laterality: Right;   COLONOSCOPY     COLONOSCOPY WITH PROPOFOL N/A 06/13/2016   Procedure: COLONOSCOPY WITH PROPOFOL;  Surgeon: Christena Deem, MD;  Location: [facility] ENDOSCOPY;  Service: Endoscopy;  Laterality: N/A;   COLONOSCOPY WITH PROPOFOL N/A 06/14/2016   Procedure: COLONOSCOPY WITH PROPOFOL;  Surgeon: Christena Deem, MD;  Location: [facility] ENDOSCOPY;  Service: Endoscopy;  Laterality: N/A;   ESOPHAGOGASTRODUODENOSCOPY (EGD) WITH PROPOFOL N/A 07/27/2018   Procedure: ESOPHAGOGASTRODUODENOSCOPY (EGD) WITH PROPOFOL;  Surgeon: Christena Deem, MD;  Location: [facility] ENDOSCOPY;  Service: Endoscopy;  Laterality: N/A;   teniditis Left    Social History:  reports that he has never smoked. He has never used smokeless tobacco. He reports current alcohol use of about 1.0 - 2.0 standard drink of alcohol per week. He reports that he does not use drugs.  No Known Allergies  No family history on file.  Prior to Admission medications   Medication Sig Start Date End Date Taking? Authorizing Provider  amLODipine (NORVASC) 5 MG tablet Take 5 mg by mouth daily.   Yes [provider]  cyclobenzaprine (FLEXERIL) 10 MG tablet Take 10 mg by mouth daily.   Yes [provider]  hydrochlorothiazide (HYDRODIURIL) 25 MG tablet Take 25 mg by mouth daily.   Yes [provider]  lisinopril (PRINIVIL,ZESTRIL) 40 MG tablet Take 40 mg by mouth daily.   Yes [provider]  loratadine (CLARITIN) 10 MG tablet Take 10 mg by mouth daily.   Yes [provider]  lovastatin (MEVACOR) 40 MG tablet Take 40 mg by mouth at bedtime.   Yes [provider]  Melatonin 10 MG TABS Take by mouth.   Yes [provider]  mupirocin ointment (BACTROBAN) 2 % Apply 1 application topically daily. With dressing changes  07/25/20  Yes Deirdre Evener, MD  omeprazole (PRILOSEC) 40 MG capsule Take 1 capsule (40 mg total) by mouth daily. 06/04/18 02/21/23 Yes Rockne Menghini, MD    Physical Exam: Vitals:   02/21/23 1000 02/21/23 1230 02/21/23 1254 02/21/23 1407  BP: 114/60 121/62  126/60  Pulse: 100 (!) 102  (!) 113  Resp: (!) 29 (!) 28  (!) 22  Temp:   98.6 F (37 C) 98.8 F (37.1 C)  TempSrc:   Oral Oral  SpO2: 100% 100%  99%  Weight:      Height:       Physical Examination: General appearance - alert, well appearing, and in no distress Chest - clear to auscultation, no wheezes, rales or rhonchi, symmetric air entry Heart - normal rate, regular rhythm, normal S1, S2, no murmurs, rubs, clicks or gallops Abdomen - soft, mild tender at central lower abdomen, nondistended, no masses or organomegaly Extremities - peripheral pulses normal, no pedal edema, no clubbing or cyanosis  Data Reviewed: Results for orders placed or performed during the hospital encounter of 02/21/23 (from the past 24 hour(s))  SARS Coronavirus 2 by RT PCR (hospital order, performed in [facility] hospital lab) *cepheid single result test* Anterior Nasal Swab     Status: None   Collection Time: 02/21/23  9:11 AM   Specimen: Anterior Nasal Swab  Result Value Ref Range   SARS Coronavirus 2 by RT PCR NEGATIVE NEGATIVE  Lactic acid, plasma     Status: None   Collection Time: 02/21/23  9:18 AM  Result Value Ref Range   Lactic Acid, Venous 1.9 0.5 - 1.9 mmol/L  Lactic acid, plasma     Status: Abnormal   Collection Time: 02/21/23  9:33 AM  Result Value Ref Range   Lactic Acid, Venous 2.5 (HH) 0.5 - 1.9 mmol/L  CBC     Status: Abnormal   Collection Time: 02/21/23  9:33 AM  Result Value Ref Range   WBC 17.4 (H) 4.0 - 10.5 K/uL   RBC 4.69 4.22 - 5.81 MIL/uL   Hemoglobin 14.9 13.0 - 17.0 g/dL   HCT 16.1 09.6 - 04.5 %   MCV 95.1 80.0 - 100.0 fL   MCH 31.8 26.0 - 34.0 pg   MCHC 33.4 30.0 - 36.0 g/dL   RDW 40.9 81.1 - 91.4 %    Platelets 232 150 - 400 K/uL   nRBC 0.0 0.0 - 0.2 %  Comprehensive metabolic panel     Status: Abnormal   Collection Time: 02/21/23  9:33 AM  Result Value Ref Range   Sodium 136 135 - 145 mmol/L   Potassium 3.4 (L) 3.5 - 5.1 mmol/L   Chloride 101 98 - 111 mmol/L   CO2 20 (L) 22 - 32 mmol/L   Glucose, Bld 190 (H) 70 - 99 mg/dL   BUN 46 (H) 8 - 23 mg/dL   Creatinine, Ser 7.82 (H) 0.61 - 1.24 mg/dL   Calcium 8.2 (L) 8.9 - 10.3 mg/dL   Total Protein  7.4 6.5 - 8.1 g/dL   Albumin 3.8 3.5 - 5.0 g/dL   AST 29 15 - 41 U/L   ALT 31 0 - 44 U/L   Alkaline Phosphatase 40 38 - 126 U/L   Total Bilirubin 0.7 0.3 - 1.2 mg/dL   GFR, Estimated 26 (L) >60 mL/min   Anion gap 15 5 - 15  Lipase, blood     Status: None   Collection Time: 02/21/23  9:33 AM  Result Value Ref Range   Lipase 44 11 - 51 U/L  CBC     Status: Abnormal   Collection Time: 02/21/23  9:40 AM  Result Value Ref Range   WBC 17.2 (H) 4.0 - 10.5 K/uL   RBC 4.69 4.22 - 5.81 MIL/uL   Hemoglobin 15.0 13.0 - 17.0 g/dL   HCT 16.1 09.6 - 04.5 %   MCV 94.5 80.0 - 100.0 fL   MCH 32.0 26.0 - 34.0 pg   MCHC 33.9 30.0 - 36.0 g/dL   RDW 40.9 81.1 - 91.4 %   Platelets 236 150 - 400 K/uL   nRBC 0.0 0.0 - 0.2 %  Urinalysis, Routine w reflex microscopic -Urine, Clean Catch     Status: Abnormal   Collection Time: 02/21/23 12:55 PM  Result Value Ref Range   Color, Urine YELLOW (A) YELLOW   APPearance HAZY (A) CLEAR   Specific Gravity, Urine 1.009 1.005 - 1.030   pH 5.0 5.0 - 8.0   Glucose, UA NEGATIVE NEGATIVE mg/dL   Hgb urine dipstick SMALL (A) NEGATIVE   Bilirubin Urine NEGATIVE NEGATIVE   Ketones, ur NEGATIVE NEGATIVE mg/dL   Protein, ur 30 (A) NEGATIVE mg/dL   Nitrite NEGATIVE NEGATIVE   Leukocytes,Ua NEGATIVE NEGATIVE   RBC / HPF 0-5 0 - 5 RBC/hpf   WBC, UA 21-50 0 - 5 WBC/hpf   Bacteria, UA NONE SEEN NONE SEEN   Squamous Epithelial / HPF 0-5 0 - 5 /HPF   WBC Clumps PRESENT    Mucus PRESENT    Hyaline Casts, UA PRESENT    Lactic acid, plasma     Status: Abnormal   Collection Time: 02/21/23 12:55 PM  Result Value Ref Range   Lactic Acid, Venous 3.7 (HH) 0.5 - 1.9 mmol/L  CBG monitoring, ED     Status: Abnormal   Collection Time: 02/21/23  5:55 PM  Result Value Ref Range   Glucose-Capillary 132 (H) 70 - 99 mg/dL   CT ABDOMEN PELVIS WO CONTRAST  Result Date: 02/21/2023 CLINICAL DATA:  Abdominal pain EXAM: CT ABDOMEN AND PELVIS WITHOUT CONTRAST TECHNIQUE: Multidetector CT imaging of the abdomen and pelvis was performed following the standard protocol without IV contrast. RADIATION DOSE REDUCTION: This exam was performed according to the departmental dose-optimization program which includes automated exposure control, adjustment of the mA and/or kV according to patient size and/or use of iterative reconstruction technique. COMPARISON:  CT abdomen and pelvis dated June 04, 2018 FINDINGS: Lower chest: No acute abnormality. Hepatobiliary: No focal liver abnormality is seen. No gallstones, gallbladder wall thickening, or biliary dilatation. Pancreas: Unremarkable. No pancreatic ductal dilatation or surrounding inflammatory changes. Spleen: Normal in size without focal abnormality. Adrenals/Urinary Tract: Bilateral adrenal glands are unremarkable. No hydronephrosis bilateral low-attenuation renal lesions, largest are compatible with simple cysts, others are too small to accurately characterize, no specific follow-up imaging is recommended. Stomach/Bowel: Stomach is within normal limits. Appendix appears normal. Mild diverticulosis. No evidence of bowel wall thickening, distention, or inflammatory changes. Vascular/Lymphatic: Aortic atherosclerosis. No enlarged  abdominal or pelvic lymph nodes. Reproductive: Prostate is unremarkable. Other: Nonspecific mild focal stranding of the central mesentery, unchanged when compared with the prior exam. No abdominal wall hernia or abnormality. No abdominopelvic ascites. Musculoskeletal:  Prior laminectomy of the lower lumbar spine. No acute or significant osseous findings. IMPRESSION: 1. No acute findings in the abdomen or pelvis. 2. Aortic Atherosclerosis (ICD10-I70.0). Electronically Signed   By: Allegra Lai M.D.   On: 02/21/2023 13:59   DG Chest Port 1 View  Result Date: 02/21/2023 CLINICAL DATA:  Questionable sepsis EXAM: PORTABLE CHEST 1 VIEW COMPARISON:  None Available. FINDINGS: Normal heart size and mediastinal contours. No acute infiltrate or edema. No effusion or pneumothorax. No acute osseous findings. IMPRESSION: No active disease. Electronically Signed   By: Tiburcio Pea M.D.   On: 02/21/2023 09:38     Assessment and Plan: * Dehydration Secondary to GI loss Aggressive IV hydration  Infectious diarrhea Suspect traveler's diarrhea Check GI panel Treat if appropriate  Type 2 diabetes mellitus without complication (HCC) Currently not on any meds Check A1c Insulin sliding scale as needed  Hyperlipidemia, mixed Continue statin  Gastroesophageal reflux disease without esophagitis Continue PPI  Essential hypertension Continue lisinopril Continue amlodipine      Advance Care Planning:   Code Status: Not on file full  Consults: None  Family Communication: Patient at bedside  Severity of Illness: The appropriate patient status for this patient is OBSERVATION. Observation status is judged to be reasonable and necessary in order to provide the required intensity of service to ensure the patient's safety. The patient's presenting symptoms, physical exam findings, and initial radiographic and laboratory data in the context of their medical condition is felt to place them at decreased risk for further clinical deterioration. Furthermore, it is anticipated that the patient will be medically stable for discharge from the hospital within 2 midnights of admission.   Author: Reva Bores, MD 02/21/2023 3:24 PM  For on call review [email or web address].

## 2023-02-21 NOTE — Assessment & Plan Note (Signed)
Continue lisinopril Continue amlodipine

## 2023-02-21 NOTE — Assessment & Plan Note (Signed)
Secondary to GI loss Aggressive IV hydration

## 2023-02-21 NOTE — ED Provider Notes (Signed)
[facility] Provider Note    Event Date/Time   First MD Initiated Contact with Patient 02/21/23 0920     (approximate)   History   Fever   HPI  Derek Dawson is a 77 y.o. male   Past medical history of hypertension, GERD, prediabetic who presents to the emergency department with diarrhea.  Associated mild cough, scratchy throat, runny nose and abdominal cramping pain as well.  No nausea or vomiting.  No fever.  He took his blood pressure at home which was normal.  He feels dehydrated.  He had his wife return from [place] in the [place] approximately 5 days ago.  Has had symptoms for approximately 1 week.  Recent hospitalizations antibiotic use.  No urinary symptoms.  No bloody stool. "Low grade" fever at home.   Wife w similar symptoms albeit more minor  Independent Historian contributed to assessment above: His wife is at bedside corroborates information given above      Physical Exam   Triage Vital Signs: ED Triage Vitals  Enc Vitals Group     BP 02/21/23 0911 (!) 87/56     Pulse Rate 02/21/23 0911 63     Resp 02/21/23 0911 18     Temp 02/21/23 0911 98.3 F (36.8 C)     Temp src --      SpO2 02/21/23 0911 96 %     Weight 02/21/23 0909 175 lb (79.4 kg)     Height 02/21/23 0909 5\' 7"  (1.702 m)     Head Circumference --      Peak Flow --      Pain Score 02/21/23 0908 8     Pain Loc --      Pain Edu? --      Excl. in GC? --     Most recent vital signs: Vitals:   02/21/23 1230 02/21/23 1254  BP: 121/62   Pulse: (!) 102   Resp: (!) 28   Temp:  98.6 F (37 C)  SpO2: 100%     General: Awake, no distress.  CV:  Good peripheral perfusion.  Resp:  Normal effort.  Abd:  No distention.  Other:  By mucous membranes and poor skin turgor appears clinically dehydrated.  Initial blood pressure 80/50 normalized with laying in bed, tachycardic to the low 100s.  Soft nontender abdomen no rigidity or guarding.     ED Results / Procedures  / Treatments   Labs (all labs ordered are listed, but only abnormal results are displayed) Labs Reviewed  URINALYSIS, ROUTINE W REFLEX MICROSCOPIC - Abnormal; Notable for the following components:      Result Value   Color, Urine YELLOW (*)    APPearance HAZY (*)    Hgb urine dipstick SMALL (*)    Protein, ur 30 (*)    All other components within normal limits  LACTIC ACID, PLASMA - Abnormal; Notable for the following components:   Lactic Acid, Venous 2.5 (*)    All other components within normal limits  CBC - Abnormal; Notable for the following components:   WBC 17.4 (*)    All other components within normal limits  COMPREHENSIVE METABOLIC PANEL - Abnormal; Notable for the following components:   Potassium 3.4 (*)    CO2 20 (*)    Glucose, Bld 190 (*)    BUN 46 (*)    Creatinine, Ser 2.54 (*)    Calcium 8.2 (*)    GFR, Estimated 26 (*)    All  other components within normal limits  CBC - Abnormal; Notable for the following components:   WBC 17.2 (*)    All other components within normal limits  LACTIC ACID, PLASMA - Abnormal; Notable for the following components:   Lactic Acid, Venous 3.7 (*)    All other components within normal limits  SARS CORONAVIRUS 2 BY RT PCR  CULTURE, BLOOD (ROUTINE X 2)  CULTURE, BLOOD (ROUTINE X 2)  LACTIC ACID, PLASMA  LIPASE, BLOOD     I ordered and reviewed the above labs they are notable for WBC 17, normal H&H, Cr elevated 2.5 from baseline 1  EKG  ED ECG REPORT I, Pilar Jarvis, the attending physician, personally viewed and interpreted this ECG.   Date: 02/21/2023  EKG Time: 0927  Rate: 101  Rhythm: sinus tachycardia  Axis: nl  Intervals: LBBB  ST&T Change: no stemi  Radiology  I independently interpreted the CT of your abdomen did not see any obvious obstructive or infectious pathologies   PROCEDURES:  Critical Care performed: Yes, see critical care procedure note(s)  .Critical Care  Performed by: Pilar Jarvis,  MD Authorized by: Pilar Jarvis, MD   Critical care provider statement:    Critical care time (minutes):  30   Critical care was time spent personally by me on the following activities:  Development of treatment plan with patient or surrogate, discussions with consultants, evaluation of patient's response to treatment, examination of patient, ordering and review of laboratory studies, ordering and review of radiographic studies, ordering and performing treatments and interventions, pulse oximetry, re-evaluation of patient's condition and review of old charts    MEDICATIONS ORDERED IN ED: Medications  sodium chloride 0.9 % bolus 1,000 mL (0 mLs Intravenous Stopped 02/21/23 1040)  sodium chloride 0.9 % bolus 1,000 mL (0 mLs Intravenous Stopped 02/21/23 1244)  sodium chloride 0.9 % bolus 1,000 mL (1,000 mLs Intravenous Bolus 02/21/23 1253)     IMPRESSION / MDM / ASSESSMENT AND PLAN / ED COURSE  I reviewed the triage vital signs and the nursing notes.                                Patient's presentation is most consistent with acute presentation with potential threat to life or bodily function.  Differential diagnosis includes, but is not limited to, dehydration, electrolyte disturbance, gastroenteritis, viral URI, intra-abdominal infection, sepsis   The patient is on the cardiac monitor to evaluate for evidence of arrhythmia and/or significant heart rate changes.  MDM:    Diffuse watery diarrhea without blood or fever and associated very minor respiratory infectious symptoms after the cruise last week, benign abdominal exam so I doubt surgical abdominal pathology.  He does look dehydrated with tachycardia hypotension and clinical dehydration signs on my exam.  AKI labs from hypovolemia. Will give IV crystalloid bolus and check labs, kidney function, electrolytes.  I considered sepsis but will defer on antibiotics at this time given suspicion of viral source and vital sign abnormalities driven  by most likely dehydration - leukocytosis is nonspecific and with a benign abdominal examination of the surgical infectious process at this time.  Left bundle branch block that appears new from 2019 EKG without chest pain or shortness of breath doubt this is cardiac pulmonary or ischemic related may be chronic or related to electrolyte disturbances and hypoanemia and diarrhea.  --- He has an acute kidney injury from hypovolemia 2.5 from baseline morning.  He  is currently 2 L and his heart rate is improved he feels better but has had only very minimal urine output.  Will order for third liter.  He complains of abdominal pain ongoing - I will obtain a dry CT to assess for surgical pathology.  I considered admission and offered admission for AKI and hypokalemia however the patient declines at this time stating that he had rather hydrated at home and follow-up with his PMD.  Plan will be for discharge after third IV crystalloid bolus and if CT scan shows no emergent pathologies, per patient wishes.  --  3 L of IV crystalloid patient has still had only minimal urine output, remains tachycardic to the 100s to 110s and has had a rising lactic from 2 now to 3's.  Fortunately his CT scan shows no surgical pathologies.  However given his AKI and rising lactic acidosis/dehydration I suggested that the patient remain in the hospital for further hydration and recheck of his labs prior to being discharged he is agreeable to this time.       FINAL CLINICAL IMPRESSION(S) / ED DIAGNOSES   Final diagnoses:  Diarrhea, unspecified type  Viral illness  Dehydration  LBBB (left bundle branch block)  AKI (acute kidney injury) (HCC)     Rx / DC Orders   ED Discharge Orders     None        Note:  This document was prepared using Dragon voice recognition software and may include unintentional dictation errors.    Pilar Jarvis, MD 02/21/23 [phone]

## 2023-02-21 NOTE — Assessment & Plan Note (Signed)
Suspect traveler's diarrhea Check GI panel Treat if appropriate

## 2023-02-21 NOTE — ED Triage Notes (Signed)
Pt states coming in due to Covid concern. Pt states he has had a fever and diarrhea and low blood pressure at times. Pt walked to triage with no complications. Pt denies vomiting, but states he just got back from a cruise to the [place]

## 2023-02-21 NOTE — Assessment & Plan Note (Signed)
Currently not on any meds Check A1c Insulin sliding scale as needed

## 2023-02-21 NOTE — Hospital Course (Signed)
Patient is a 77 year old history of HTN, HLD, GERD who has returned from a cruise to the [place] with 5-day history of profuse watery diarrhea.  He denies nausea and vomiting.  He denies rectal bleeding.  In the ED was noted to have a lactic acid of 3.7 and a negative CT scan.  He was also noted to have acute kidney injury.  We are asked to admit for IV hydration as well as any other treatment needed for acute infectious diarrhea.

## 2023-02-21 NOTE — Assessment & Plan Note (Signed)
Continue statin. 

## 2023-02-21 NOTE — Assessment & Plan Note (Addendum)
Continue PPI ?

## 2023-02-21 NOTE — Discharge Instructions (Addendum)
Drink plenty of fluids to stay well-hydrated-find Pedialyte or similar electrolyte rehydration formulas at your local pharmacy and drink throughout the day.  For diarrhea you may take an antidiarrheal like loperamide at your local pharmacy and take it directed.  You may take acetaminophen 650 mg and ibuprofen 400 mg every 6 hours for pain.  Take with food.  Call your doctor for a follow-up appointment within this next week.  Your heart rate showed a new left bundle branch block (electrical conduction abnormality) compared to the EKG that you had years ago, department follow-up with your doctor to review this finding and discuss further evaluation as needed.  If you experience any new, worsening or unexpected symptoms then call your doctor right away or come back to the emergency department for reevaluation.

## 2023-02-22 DIAGNOSIS — E860 Dehydration: Secondary | ICD-10-CM | POA: Diagnosis present

## 2023-02-22 DIAGNOSIS — Z1152 Encounter for screening for COVID-19: Secondary | ICD-10-CM | POA: Diagnosis not present

## 2023-02-22 DIAGNOSIS — E119 Type 2 diabetes mellitus without complications: Secondary | ICD-10-CM | POA: Diagnosis present

## 2023-02-22 DIAGNOSIS — K219 Gastro-esophageal reflux disease without esophagitis: Secondary | ICD-10-CM | POA: Diagnosis present

## 2023-02-22 DIAGNOSIS — E782 Mixed hyperlipidemia: Secondary | ICD-10-CM | POA: Diagnosis present

## 2023-02-22 DIAGNOSIS — Z79899 Other long term (current) drug therapy: Secondary | ICD-10-CM | POA: Diagnosis not present

## 2023-02-22 DIAGNOSIS — R6520 Severe sepsis without septic shock: Secondary | ICD-10-CM | POA: Diagnosis present

## 2023-02-22 DIAGNOSIS — N179 Acute kidney failure, unspecified: Secondary | ICD-10-CM

## 2023-02-22 DIAGNOSIS — E663 Overweight: Secondary | ICD-10-CM | POA: Insufficient documentation

## 2023-02-22 DIAGNOSIS — A419 Sepsis, unspecified organism: Secondary | ICD-10-CM | POA: Diagnosis not present

## 2023-02-22 DIAGNOSIS — E872 Acidosis, unspecified: Secondary | ICD-10-CM

## 2023-02-22 DIAGNOSIS — I10 Essential (primary) hypertension: Secondary | ICD-10-CM | POA: Diagnosis present

## 2023-02-22 DIAGNOSIS — E876 Hypokalemia: Secondary | ICD-10-CM | POA: Insufficient documentation

## 2023-02-22 DIAGNOSIS — A4151 Sepsis due to Escherichia coli [E. coli]: Secondary | ICD-10-CM | POA: Diagnosis present

## 2023-02-22 DIAGNOSIS — E861 Hypovolemia: Secondary | ICD-10-CM | POA: Diagnosis present

## 2023-02-22 DIAGNOSIS — A044 Other intestinal Escherichia coli infections: Secondary | ICD-10-CM | POA: Diagnosis not present

## 2023-02-22 DIAGNOSIS — Z85820 Personal history of malignant melanoma of skin: Secondary | ICD-10-CM | POA: Diagnosis not present

## 2023-02-22 DIAGNOSIS — A040 Enteropathogenic Escherichia coli infection: Secondary | ICD-10-CM | POA: Diagnosis present

## 2023-02-22 DIAGNOSIS — I447 Left bundle-branch block, unspecified: Secondary | ICD-10-CM | POA: Diagnosis present

## 2023-02-22 LAB — BASIC METABOLIC PANEL
Anion gap: 12 (ref 5–15)
BUN: 40 mg/dL — ABNORMAL HIGH (ref 8–23)
CO2: 16 mmol/L — ABNORMAL LOW (ref 22–32)
Calcium: 8 mg/dL — ABNORMAL LOW (ref 8.9–10.3)
Chloride: 108 mmol/L (ref 98–111)
Creatinine, Ser: 2.1 mg/dL — ABNORMAL HIGH (ref 0.61–1.24)
GFR, Estimated: 32 mL/min — ABNORMAL LOW (ref >60)
Glucose, Bld: 102 mg/dL — ABNORMAL HIGH (ref 70–99)
Potassium: 3.3 mmol/L — ABNORMAL LOW (ref 3.5–5.1)
Sodium: 136 mmol/L (ref 135–145)

## 2023-02-22 LAB — GLUCOSE, CAPILLARY
Glucose-Capillary: 104 mg/dL — ABNORMAL HIGH (ref 70–99)
Glucose-Capillary: 126 mg/dL — ABNORMAL HIGH (ref 70–99)
Glucose-Capillary: 106 mg/dL — ABNORMAL HIGH (ref 70–99)
Glucose-Capillary: 114 mg/dL — ABNORMAL HIGH (ref 70–99)

## 2023-02-22 LAB — MAGNESIUM: Magnesium: 1.7 mg/dL (ref 1.7–2.4)

## 2023-02-22 LAB — LACTIC ACID, PLASMA: Lactic Acid, Venous: 3 mmol/L (ref 0.5–1.9)

## 2023-02-22 MED ORDER — OXYCODONE-ACETAMINOPHEN 5-325 MG PO TABS
1.0000 | ORAL_TABLET | ORAL | Status: DC | PRN
  Administered 2023-02-22 (×2): 1 via ORAL
  Filled 2023-02-22 (×2): qty 1

## 2023-02-22 MED ORDER — POTASSIUM CHLORIDE 2 MEQ/ML IV SOLN
INTRAVENOUS | Status: DC
  Filled 2023-02-22 (×5): qty 1000

## 2023-02-22 MED ORDER — CIPROFLOXACIN HCL 500 MG PO TABS
250.0000 mg | ORAL_TABLET | Freq: Two times a day (BID) | ORAL | Status: DC
  Administered 2023-02-22 – 2023-02-23 (×3): 250 mg via ORAL
  Filled 2023-02-22 (×3): qty 1

## 2023-02-22 MED ORDER — POTASSIUM CHLORIDE CRYS ER 20 MEQ PO TBCR
40.0000 meq | EXTENDED_RELEASE_TABLET | ORAL | Status: AC
  Administered 2023-02-22 (×2): 40 meq via ORAL
  Filled 2023-02-22 (×2): qty 2

## 2023-02-22 MED ORDER — SODIUM BICARBONATE 650 MG PO TABS
650.0000 mg | ORAL_TABLET | Freq: Two times a day (BID) | ORAL | Status: DC
  Administered 2023-02-22 – 2023-02-23 (×3): 650 mg via ORAL
  Filled 2023-02-22 (×3): qty 1

## 2023-02-22 NOTE — Progress Notes (Signed)
  Progress Note   Patient: Derek Dawson ZOX:[id number] DOB: December 25, 1945 DOA: 02/21/2023     0 DOS: the patient was seen and examined on 02/22/2023   Brief hospital course: MIKA ANASTASI is a 77 y.o. male with medical history significant of  HTN, HLD, GERD who has returned from a cruise to the [place] with 5-day history of profuse watery diarrhea.  Upon arriving the hospital, patient had a severe lactic acidosis of 3.7, vital signs with heart rate 100, respirate 29, leukocytosis of 17.4.  He also had acute renal failure with creatinine of 2.54.  Patient received IV fluid bolus.  Admitted to the hospital for treatment. Stool study she is positive for enteropathic E. coli.  Oral Cipro was started on 6/8.   Principal Problem:   Dehydration Active Problems:   Essential hypertension   Gastroesophageal reflux disease without esophagitis   Hyperlipidemia, mixed   Type 2 diabetes mellitus without complication (HCC)   Infectious diarrhea   E. coli gastroenteritis   AKI (acute kidney injury) (HCC)   Overweight (BMI 25.0-29.9)   Severe sepsis (HCC)   Assessment and Plan: Acute kidney injury. Metabolic acidosis. Hypokalemia. Patient is had 5 days of diarrhea, acute renal failure most likely due to dehydration from diarrhea. Clinically, patient does not have any additional presentation for hemolytic uremic syndrome. Renal function still elevated after IV fluids, continue rehydration with lactated Ringer with added potassium.  I also added oral potassium bicarbonate. Obtain bladder scan to rule out urinary retention.  Severe sepsis secondary to E. coli enteritis. Enteropathic E. coli gastroenteritis. Given severity of infection, will start Cipro, will start with 250 twice a day for renal dosing.  Continue for 3 days. Patient currently hemodynamically stable.  Type 2 diabetes mellitus without complication (HCC) A1c 6.7, well-controlled.  Continue sliding scale insulin.  Gastroesophageal  reflux disease without esophagitis PPI will be continued.  Essential hypertension Continue amlodipine, discontinue lisinopril for acute renal failure.       Subjective:  Patient feel much better today, no altered mental status.  Still has some loose stools, no nausea vomiting.  No fever or chills.  Physical Exam: Vitals:   02/21/23 1900 02/21/23 1949 02/22/23 0332 02/22/23 0841  BP: (!) 114/52 (!) 108/50 (!) 117/55 121/79  Pulse: (!) 108 (!) 110 95 (!) 108  Resp: 18 20 18 20   Temp:  98.6 F (37 C) 98.2 F (36.8 C) 98.2 F (36.8 C)  TempSrc:  Oral Oral Oral  SpO2: 95% 95% 95% 97%  Weight:      Height:       General exam: Appears calm and comfortable  Respiratory system: Clear to auscultation. Respiratory effort normal. Cardiovascular system: S1 & S2 heard, RRR. No JVD, murmurs, rubs, gallops or clicks. No pedal edema. Gastrointestinal system: Abdomen is nondistended, soft and nontender. No organomegaly or masses felt. Normal bowel sounds heard. Central nervous system: Alert and oriented x3. No focal neurological deficits. Extremities: Symmetric 5 x 5 power. Skin: No rashes, lesions or ulcers Psychiatry: Judgement and insight appear normal. Mood & affect appropriate.    Data Reviewed:  Review lab results, blood cultures so far no growth.  Reviewed chest x-ray results and CT abdomen/pelvis results.  Family Communication: None  Disposition: Status is: Inpatient Remains inpatient appropriate because: Severity of disease, IV treatment.     Time spent: 50 minutes  Author: Marrion Coy, MD 02/22/2023 10:32 AM  For on call review [email or web address].

## 2023-02-22 NOTE — Hospital Course (Signed)
Derek Dawson is a 77 y.o. male with medical history significant of  HTN, HLD, GERD who has returned from a cruise to the [place] with 5-day history of profuse watery diarrhea.  Upon arriving the hospital, patient had a severe lactic acidosis of 3.7, vital signs with heart rate 100, respirate 29, leukocytosis of 17.4.  He also had acute renal failure with creatinine of 2.54.  Patient received IV fluid bolus.  Admitted to the hospital for treatment. Stool study she is positive for enteropathic E. coli.  Oral Cipro was started on 6/8.

## 2023-02-23 DIAGNOSIS — E860 Dehydration: Secondary | ICD-10-CM | POA: Diagnosis not present

## 2023-02-23 DIAGNOSIS — A419 Sepsis, unspecified organism: Secondary | ICD-10-CM | POA: Diagnosis not present

## 2023-02-23 DIAGNOSIS — N179 Acute kidney failure, unspecified: Secondary | ICD-10-CM | POA: Diagnosis not present

## 2023-02-23 DIAGNOSIS — A044 Other intestinal Escherichia coli infections: Secondary | ICD-10-CM | POA: Diagnosis not present

## 2023-02-23 LAB — BASIC METABOLIC PANEL
Anion gap: 5 (ref 5–15)
BUN: 30 mg/dL — ABNORMAL HIGH (ref 8–23)
CO2: 20 mmol/L — ABNORMAL LOW (ref 22–32)
Calcium: 7.4 mg/dL — ABNORMAL LOW (ref 8.9–10.3)
Chloride: 113 mmol/L — ABNORMAL HIGH (ref 98–111)
Creatinine, Ser: 1.58 mg/dL — ABNORMAL HIGH (ref 0.61–1.24)
GFR, Estimated: 45 mL/min — ABNORMAL LOW (ref >60)
Glucose, Bld: 92 mg/dL (ref 70–99)
Potassium: 4.2 mmol/L (ref 3.5–5.1)
Sodium: 138 mmol/L (ref 135–145)

## 2023-02-23 LAB — CBC
HCT: 35.1 % — ABNORMAL LOW (ref 39.0–52.0)
Hemoglobin: 11.8 g/dL — ABNORMAL LOW (ref 13.0–17.0)
MCH: 31.6 pg (ref 26.0–34.0)
MCHC: 33.6 g/dL (ref 30.0–36.0)
MCV: 94.1 fL (ref 80.0–100.0)
Platelets: 213 10*3/uL (ref 150–400)
RBC: 3.73 MIL/uL — ABNORMAL LOW (ref 4.22–5.81)
RDW: 12.9 % (ref 11.5–15.5)
WBC: 11.5 10*3/uL — ABNORMAL HIGH (ref 4.0–10.5)
nRBC: 0 % (ref 0.0–0.2)

## 2023-02-23 LAB — MAGNESIUM: Magnesium: 1.7 mg/dL (ref 1.7–2.4)

## 2023-02-23 LAB — GLUCOSE, CAPILLARY: Glucose-Capillary: 101 mg/dL — ABNORMAL HIGH (ref 70–99)

## 2023-02-23 MED ORDER — CIPROFLOXACIN HCL 250 MG PO TABS: 250.0000 mg | ORAL_TABLET | Freq: Two times a day (BID) | ORAL | 0 refills | Status: AC

## 2023-02-23 MED ORDER — SODIUM BICARBONATE 650 MG PO TABS: 650.0000 mg | ORAL_TABLET | Freq: Two times a day (BID) | ORAL | 0 refills | Status: AC

## 2023-02-23 NOTE — Discharge Summary (Signed)
Physician Discharge Summary   Patient: Derek Dawson MRN: [id number] DOB: 1945-12-12  Admit date:     02/21/2023  Discharge date: 02/23/23  Discharge Physician: Marrion Coy   PCP: Dorothey Baseman, MD   Recommendations at discharge:   Follow-up with PCP in 1 week. Repeat BMP at next office visit.  Discharge Diagnoses: Principal Problem:   Dehydration Active Problems:   Essential hypertension   Gastroesophageal reflux disease without esophagitis   Hyperlipidemia, mixed   Type 2 diabetes mellitus without complication (HCC)   Infectious diarrhea   E. coli gastroenteritis   AKI (acute kidney injury) (HCC)   Overweight (BMI 25.0-29.9)   Severe sepsis (HCC)   Hypokalemia   Metabolic acidosis  Resolved Problems:   * No resolved hospital problems. *  Hospital Course: Derek Dawson is a 77 y.o. male with medical history significant of  HTN, HLD, GERD who has returned from a cruise to the [place] with 5-day history of profuse watery diarrhea.  Upon arriving the hospital, patient had a severe lactic acidosis of 3.7, vital signs with heart rate 100, respirate 29, leukocytosis of 17.4.  He also had acute renal failure with creatinine of 2.54.  Patient received IV fluid bolus.  Admitted to the hospital for treatment. Stool study she is positive for enteropathic E. coli.  Oral Cipro was started on 6/8. Patient is also treated with IV fluids, renal function is improving, metabolic acidosis is also improving.  Patient symptom has largely improved, medically stable to be discharged.   Assessment and Plan: Acute kidney injury. Metabolic acidosis. Hypokalemia. Patient is had 5 days of diarrhea, acute renal failure most likely due to dehydration from diarrhea. Clinically, patient does not have any signs of hemolytic uremic syndrome. Patient has been treated with IV fluids, renal function improving, metabolic acidosis also improving.  Potassium normalized.  Patient is tolerating diet  without any nausea vomiting, diarrhea also slowing down.  Medically stable to be discharged.   Severe sepsis secondary to E. coli enteritis. Enteropathic E. coli gastroenteritis. Given severity of infection, started Cipro 250 mg twice a day for renal dosing.  Continue Cipro for additional 4 days to complete 5 days antibiotics.   Type 2 diabetes mellitus without complication (HCC) A1c 6.7, well-controlled.  Follow-up with PCP as outpatient.   Gastroesophageal reflux disease without esophagitis PPI will be continued.   Essential hypertension Continue amlodipine, hold lisinopril and hydrochlorothiazide for acute renal failure.  May restart if renal function normalized after seen by PCP.       Consultants: None Procedures performed: None  Disposition: Home Diet recommendation:  Discharge Diet Orders (From admission, onward)     Start     Ordered   02/23/23 0000  Diet - low sodium heart healthy        02/23/23 0858           Cardiac diet DISCHARGE MEDICATION: Allergies as of 02/23/2023   No Known Allergies      Medication List     STOP taking these medications    hydrochlorothiazide 25 MG tablet Commonly known as: HYDRODIURIL   lisinopril 40 MG tablet Commonly known as: ZESTRIL       TAKE these medications    amLODipine 5 MG tablet Commonly known as: NORVASC Take 5 mg by mouth daily.   ciprofloxacin 250 MG tablet Commonly known as: CIPRO Take 1 tablet (250 mg total) by mouth 2 (two) times daily for 4 days.   cyclobenzaprine 10 MG tablet Commonly known  as: FLEXERIL Take 10 mg by mouth daily.   loratadine 10 MG tablet Commonly known as: CLARITIN Take 10 mg by mouth daily.   lovastatin 40 MG tablet Commonly known as: MEVACOR Take 40 mg by mouth at bedtime.   Melatonin 10 MG Tabs Take by mouth.   mupirocin ointment 2 % Commonly known as: BACTROBAN Apply 1 application topically daily. With dressing changes   omeprazole 40 MG capsule Commonly  known as: PriLOSEC Take 1 capsule (40 mg total) by mouth daily.   sodium bicarbonate 650 MG tablet Take 1 tablet (650 mg total) by mouth 2 (two) times daily for 7 days.        Follow-up Information     Dorothey Baseman, MD. Schedule an appointment as soon as possible for a visit in 1 week(s).   Specialty: Family Medicine Contact information: [address] [place] [address] [phone]                Discharge Exam: Filed Weights   02/21/23 0909  Weight: 79.4 kg   General exam: Appears calm and comfortable  Respiratory system: Clear to auscultation. Respiratory effort normal. Cardiovascular system: S1 & S2 heard, RRR. No JVD, murmurs, rubs, gallops or clicks. No pedal edema. Gastrointestinal system: Abdomen is nondistended, soft and nontender. No organomegaly or masses felt. Normal bowel sounds heard. Central nervous system: Alert and oriented. No focal neurological deficits. Extremities: Symmetric 5 x 5 power. Skin: No rashes, lesions or ulcers Psychiatry: Judgement and insight appear normal. Mood & affect appropriate.    Condition at discharge: good  The results of significant diagnostics from this hospitalization (including imaging, microbiology, ancillary and laboratory) are listed below for reference.   Imaging Studies: CT ABDOMEN PELVIS WO CONTRAST  Result Date: 02/21/2023 CLINICAL DATA:  Abdominal pain EXAM: CT ABDOMEN AND PELVIS WITHOUT CONTRAST TECHNIQUE: Multidetector CT imaging of the abdomen and pelvis was performed following the standard protocol without IV contrast. RADIATION DOSE REDUCTION: This exam was performed according to the departmental dose-optimization program which includes automated exposure control, adjustment of the mA and/or kV according to patient size and/or use of iterative reconstruction technique. COMPARISON:  CT abdomen and pelvis dated June 04, 2018 FINDINGS: Lower chest: No acute abnormality. Hepatobiliary: No focal liver  abnormality is seen. No gallstones, gallbladder wall thickening, or biliary dilatation. Pancreas: Unremarkable. No pancreatic ductal dilatation or surrounding inflammatory changes. Spleen: Normal in size without focal abnormality. Adrenals/Urinary Tract: Bilateral adrenal glands are unremarkable. No hydronephrosis bilateral low-attenuation renal lesions, largest are compatible with simple cysts, others are too small to accurately characterize, no specific follow-up imaging is recommended. Stomach/Bowel: Stomach is within normal limits. Appendix appears normal. Mild diverticulosis. No evidence of bowel wall thickening, distention, or inflammatory changes. Vascular/Lymphatic: Aortic atherosclerosis. No enlarged abdominal or pelvic lymph nodes. Reproductive: Prostate is unremarkable. Other: Nonspecific mild focal stranding of the central mesentery, unchanged when compared with the prior exam. No abdominal wall hernia or abnormality. No abdominopelvic ascites. Musculoskeletal: Prior laminectomy of the lower lumbar spine. No acute or significant osseous findings. IMPRESSION: 1. No acute findings in the abdomen or pelvis. 2. Aortic Atherosclerosis (ICD10-I70.0). Electronically Signed   By: Allegra Lai M.D.   On: 02/21/2023 13:59   DG Chest Port 1 View  Result Date: 02/21/2023 CLINICAL DATA:  Questionable sepsis EXAM: PORTABLE CHEST 1 VIEW COMPARISON:  None Available. FINDINGS: Normal heart size and mediastinal contours. No acute infiltrate or edema. No effusion or pneumothorax. No acute osseous findings. IMPRESSION: No active disease.  Electronically Signed   By: Tiburcio Pea M.D.   On: 02/21/2023 09:38    Microbiology: Results for orders placed or performed during the hospital encounter of 02/21/23  SARS Coronavirus 2 by RT PCR (hospital order, performed in [facility] hospital lab) *cepheid single result test* Anterior Nasal Swab     Status: None   Collection Time: 02/21/23  9:11 AM   Specimen: Anterior  Nasal Swab  Result Value Ref Range Status   SARS Coronavirus 2 by RT PCR NEGATIVE NEGATIVE Final    Comment: (NOTE) SARS-CoV-2 target nucleic acids are NOT DETECTED.  The SARS-CoV-2 RNA is generally detectable in upper and lower respiratory specimens during the acute phase of infection. The lowest concentration of SARS-CoV-2 viral copies this assay can detect is 250 copies / mL. A negative result does not preclude SARS-CoV-2 infection and should not be used as the sole basis for treatment or other patient management decisions.  A negative result may occur with improper specimen collection / handling, submission of specimen other than nasopharyngeal swab, presence of viral mutation(s) within the areas targeted by this assay, and inadequate number of viral copies (<250 copies / mL). A negative result must be combined with clinical observations, patient history, and epidemiological information.  Fact Sheet for Patients:   RoadLapTop.co.za  Fact Sheet for Healthcare Providers: http://kim-miller.com/  This test is not yet approved or  cleared by the [place] FDA and has been authorized for detection and/or diagnosis of SARS-CoV-2 by FDA under an Emergency Use Authorization (EUA).  This EUA will remain in effect (meaning this test can be used) for the duration of the COVID-19 declaration under Section 564(b)(1) of the Act, 21 U.S.C. section 360bbb-3(b)(1), unless the authorization is terminated or revoked sooner.  Performed at [facility], [address] [place], [place] [address]   Culture, blood (routine x 2)     Status: None (Preliminary result)   Collection Time: 02/21/23  9:33 AM   Specimen: BLOOD  Result Value Ref Range Status   Specimen Description BLOOD LEFT ANTECUBITAL  Final   Special Requests   Final    BOTTLES DRAWN AEROBIC AND ANAEROBIC Blood Culture adequate volume   Culture   Final    NO GROWTH 2  DAYS Performed at [facility], [address] [place], [place] [address]    Report Status PENDING  Incomplete  Culture, blood (routine x 2)     Status: None (Preliminary result)   Collection Time: 02/21/23  9:39 AM   Specimen: BLOOD  Result Value Ref Range Status   Specimen Description BLOOD BLOOD LEFT FOREARM  Final   Special Requests   Final    BOTTLES DRAWN AEROBIC AND ANAEROBIC Blood Culture adequate volume   Culture   Final    NO GROWTH 2 DAYS Performed at [facility], [address], [place], [place] [address]    Report Status PENDING  Incomplete  C Difficile Quick Screen w PCR reflex     Status: None   Collection Time: 02/21/23  6:32 PM   Specimen: STOOL  Result Value Ref Range Status   C Diff antigen NEGATIVE NEGATIVE Final   C Diff toxin NEGATIVE NEGATIVE Final   C Diff interpretation No C. difficile detected.  Final    Comment: Performed at [facility], [address] [place], [place] [address]  Gastrointestinal Panel by PCR , Stool     Status: Abnormal   Collection Time: 02/21/23  6:32 PM  Specimen: STOOL  Result Value Ref Range Status   Campylobacter species NOT DETECTED NOT DETECTED Final   Plesimonas shigelloides NOT DETECTED NOT DETECTED Final   Salmonella species NOT DETECTED NOT DETECTED Final   Yersinia enterocolitica NOT DETECTED NOT DETECTED Final   Vibrio species NOT DETECTED NOT DETECTED Final   Vibrio cholerae NOT DETECTED NOT DETECTED Final   Enteroaggregative E coli (EAEC) NOT DETECTED NOT DETECTED Final   Enteropathogenic E coli (EPEC) DETECTED (A) NOT DETECTED Final    Comment: CRITICAL RESULT CALLED TO, READ BACK BY AND VERIFIED WITH: MARSHA [facility] RN @ 2012 02/21/23 [facility]    Enterotoxigenic E coli (ETEC) NOT DETECTED NOT DETECTED Final   Shiga like toxin producing E coli (STEC) NOT DETECTED NOT DETECTED Final   Shigella/Enteroinvasive E coli (EIEC) NOT DETECTED NOT DETECTED Final   Cryptosporidium NOT DETECTED NOT  DETECTED Final   Cyclospora cayetanensis NOT DETECTED NOT DETECTED Final   Entamoeba histolytica NOT DETECTED NOT DETECTED Final   Giardia lamblia NOT DETECTED NOT DETECTED Final   Adenovirus F40/41 NOT DETECTED NOT DETECTED Final   Astrovirus NOT DETECTED NOT DETECTED Final   Norovirus GI/GII NOT DETECTED NOT DETECTED Final   Rotavirus A NOT DETECTED NOT DETECTED Final   Sapovirus (I, II, IV, and V) NOT DETECTED NOT DETECTED Final    Comment: Performed at [facility], [address] [place], [place] [address]    Labs: CBC: Recent Labs  Lab 02/21/23 0933 02/21/23 0940 02/23/23 0437  WBC 17.4* 17.2* 11.5*  HGB 14.9 15.0 11.8*  HCT 44.6 44.3 35.1*  MCV 95.1 94.5 94.1  PLT 232 236 213   Basic Metabolic Panel: Recent Labs  Lab 02/21/23 0933 02/22/23 0816 02/23/23 0437  NA 136 136 138  K 3.4* 3.3* 4.2  CL 101 108 113*  CO2 20* 16* 20*  GLUCOSE 190* 102* 92  BUN 46* 40* 30*  CREATININE 2.54* 2.10* 1.58*  CALCIUM 8.2* 8.0* 7.4*  MG  --  1.7 1.7   Liver Function Tests: Recent Labs  Lab 02/21/23 0933  AST 29  ALT 31  ALKPHOS 40  BILITOT 0.7  PROT 7.4  ALBUMIN 3.8   CBG: Recent Labs  Lab 02/22/23 0832 02/22/23 1203 02/22/23 1635 02/22/23 2106 02/23/23 0848  GLUCAP 104* 126* 106* 114* 101*    Discharge time spent: greater than 30 minutes.  Signed: Marrion Coy, MD [facility] 02/23/2023

## 2023-02-23 NOTE — Progress Notes (Signed)
Derek Dawson to be discharged Home per MD order. Discussed prescriptions and follow up appointments with the patient. Prescriptions given to patient, medication list explained in detail. Patient verbalized understanding.  Allergies as of 02/23/2023   No Known Allergies      Medication List     STOP taking these medications    hydrochlorothiazide 25 MG tablet Commonly known as: HYDRODIURIL   lisinopril 40 MG tablet Commonly known as: ZESTRIL       TAKE these medications    amLODipine 5 MG tablet Commonly known as: NORVASC Take 5 mg by mouth daily.   ciprofloxacin 250 MG tablet Commonly known as: CIPRO Take 1 tablet (250 mg total) by mouth 2 (two) times daily for 4 days.   cyclobenzaprine 10 MG tablet Commonly known as: FLEXERIL Take 10 mg by mouth daily.   loratadine 10 MG tablet Commonly known as: CLARITIN Take 10 mg by mouth daily.   lovastatin 40 MG tablet Commonly known as: MEVACOR Take 40 mg by mouth at bedtime.   Melatonin 10 MG Tabs Take by mouth.   mupirocin ointment 2 % Commonly known as: BACTROBAN Apply 1 application topically daily. With dressing changes   omeprazole 40 MG capsule Commonly known as: PriLOSEC Take 1 capsule (40 mg total) by mouth daily.   sodium bicarbonate 650 MG tablet Take 1 tablet (650 mg total) by mouth 2 (two) times daily for 7 days.        Vitals:   02/23/23 0427 02/23/23 0917  BP: (!) 112/58 (!) 116/58  Pulse: 67 74  Resp: 16 18  Temp: 97.8 F (36.6 C) 98.1 F (36.7 C)  SpO2: 97% 97%    Skin clean, dry and intact without evidence of skin break down and or skin tears. IV catheter discontinued intact. Site without signs and symptoms of complications. Dressing and pressure applied. Patient denies pain at this time. No complaints noted.  An After Visit Summary was printed and given to the patient. Patient escorted via wheelchair and discharged Home home via private auto.  Madie Reno, RN

## 2023-02-24 LAB — CULTURE, BLOOD (ROUTINE X 2)

## 2023-02-25 LAB — CULTURE, BLOOD (ROUTINE X 2): Culture: NO GROWTH

## 2023-02-26 LAB — CULTURE, BLOOD (ROUTINE X 2)
Culture: NO GROWTH
Special Requests: ADEQUATE
Special Requests: ADEQUATE

## 2023-05-07 ENCOUNTER — Encounter: Payer: Self-pay | Admitting: Dermatology

## 2023-05-07 ENCOUNTER — Ambulatory Visit (INDEPENDENT_AMBULATORY_CARE_PROVIDER_SITE_OTHER): Payer: Medicare Other | Admitting: Dermatology

## 2023-05-07 VITALS — BP 147/67 | HR 67

## 2023-05-07 DIAGNOSIS — Z1283 Encounter for screening for malignant neoplasm of skin: Secondary | ICD-10-CM

## 2023-05-07 DIAGNOSIS — L729 Follicular cyst of the skin and subcutaneous tissue, unspecified: Secondary | ICD-10-CM

## 2023-05-07 DIAGNOSIS — D1801 Hemangioma of skin and subcutaneous tissue: Secondary | ICD-10-CM

## 2023-05-07 DIAGNOSIS — L820 Inflamed seborrheic keratosis: Secondary | ICD-10-CM | POA: Diagnosis not present

## 2023-05-07 DIAGNOSIS — W908XXA Exposure to other nonionizing radiation, initial encounter: Secondary | ICD-10-CM | POA: Diagnosis not present

## 2023-05-07 DIAGNOSIS — L814 Other melanin hyperpigmentation: Secondary | ICD-10-CM

## 2023-05-07 DIAGNOSIS — L720 Epidermal cyst: Secondary | ICD-10-CM

## 2023-05-07 DIAGNOSIS — L578 Other skin changes due to chronic exposure to nonionizing radiation: Secondary | ICD-10-CM

## 2023-05-07 DIAGNOSIS — Z85820 Personal history of malignant melanoma of skin: Secondary | ICD-10-CM

## 2023-05-07 DIAGNOSIS — L821 Other seborrheic keratosis: Secondary | ICD-10-CM

## 2023-05-07 DIAGNOSIS — Z86018 Personal history of other benign neoplasm: Secondary | ICD-10-CM

## 2023-05-07 NOTE — Progress Notes (Signed)
Follow-Up Visit   Subjective  Derek Dawson is a 77 y.o. male who presents for the following: Skin Cancer Screening and Full Body Skin Exam  The patient presents for Total-Body Skin Exam (TBSE) for skin cancer screening and mole check. The patient has spots, moles and lesions to be evaluated, some may be new or changing and the patient may have concern these could be cancer.  Patient with hx of MM and dysplastic nevi. Patient does have a spot at left wrist he would like removed.   The following portions of the chart were reviewed this encounter and updated as appropriate: medications, allergies, medical history  Review of Systems:  No other skin or systemic complaints except as noted in HPI or Assessment and Plan.  Objective  Well appearing patient in no apparent distress; mood and affect are within normal limits.  A full examination was performed including scalp, head, eyes, ears, nose, lips, neck, chest, axillae, abdomen, back, buttocks, bilateral upper extremities, bilateral lower extremities, hands, feet, fingers, toes, fingernails, and toenails. All findings within normal limits unless otherwise noted below.   Relevant physical exam findings are noted in the Assessment and Plan.  Left Wrist x 1 , right arm x 1, left lower leg x 1 Erythematous stuck-on, waxy papule or plaque    Assessment & Plan   SKIN CANCER SCREENING PERFORMED TODAY.  ACTINIC DAMAGE - Chronic condition, secondary to cumulative UV/sun exposure - diffuse scaly erythematous macules with underlying dyspigmentation - Recommend daily broad spectrum sunscreen SPF 30+ to sun-exposed areas, reapply every 2 hours as needed.  - Staying in the shade or wearing long sleeves, sun glasses (UVA+UVB protection) and wide brim hats (4-inch brim around the entire circumference of the hat) are also recommended for sun protection.  - Call for new or changing lesions.  LENTIGINES, SEBORRHEIC KERATOSES, HEMANGIOMAS - Benign  normal skin lesions - Benign-appearing - Call for any changes  MELANOCYTIC NEVI - Tan-brown and/or pink-flesh-colored symmetric macules and papules - Benign appearing on exam today - Observation - Call clinic for new or changing moles - Recommend daily use of broad spectrum spf 30+ sunscreen to sun-exposed areas.   History of Dysplastic Nevi - No evidence of recurrence today - Recommend regular full body skin exams - Recommend daily broad spectrum sunscreen SPF 30+ to sun-exposed areas, reapply every 2 hours as needed.  - Call if any new or changing lesions are noted between office visits  HISTORY OF MELANOMA - Left low back 4.0 cm lat to spine - 12/31/2017  - No evidence of recurrence today - No lymphadenopathy - Recommend regular full body skin exams - Recommend daily broad spectrum sunscreen SPF 30+ to sun-exposed areas, reapply every 2 hours as needed.  - Call if any new or changing lesions are noted between office visits     Inflamed seborrheic keratosis Left Wrist x 1 , right arm x 1, left lower leg x 1  Symptomatic, irritating, patient would like treated.  Benign-appearing.  Call clinic for new or changing lesions.    Destruction of lesion - Left Wrist x 1 , right arm x 1, left lower leg x 1  Destruction method: cryotherapy   Informed consent: discussed and consent obtained   Lesion destroyed using liquid nitrogen: Yes   Cryotherapy cycles:  2 Outcome: patient tolerated procedure well with no complications   Post-procedure details: wound care instructions given    EPIDERMAL INCLUSION CYST Exam: Subcutaneous nodule at upper back spinal 1.0 cm  Benign-appearing. Exam most consistent with an epidermal inclusion cyst. Discussed that a cyst is a benign growth that can grow over time and sometimes get irritated or inflamed. Recommend observation if it is not bothersome. Discussed option of surgical excision to remove it if it is growing, symptomatic, or other changes  noted. Please call for new or changing lesions so they can be evaluated.    Return in about 1 year (around 05/06/2024) for TBSE, Hx MM, Hx Dysplastic Nevi, ISK follow up Nov - Jan.  Anise Salvo, RMA, am acting as scribe for Armida Sans, MD .   Documentation: I have reviewed the above documentation for accuracy and completeness, and I agree with the above.  Armida Sans, MD

## 2023-05-07 NOTE — Patient Instructions (Addendum)

## 2023-05-13 ENCOUNTER — Telehealth: Payer: Self-pay

## 2023-05-13 NOTE — Telephone Encounter (Signed)
Patient called to make sure reb listers are normal after freezing,  Yes blisters are normal, apply plain vaseline

## 2023-05-14 ENCOUNTER — Other Ambulatory Visit: Payer: Self-pay | Admitting: Internal Medicine

## 2023-05-14 DIAGNOSIS — E782 Mixed hyperlipidemia: Secondary | ICD-10-CM

## 2023-07-14 ENCOUNTER — Ambulatory Visit
Admission: RE | Admit: 2023-07-14 | Discharge: 2023-07-14 | Disposition: A | Discharge status: Home / Self Care | Payer: Medicare Other | Source: Ambulatory Visit | Attending: Internal Medicine | Admitting: Internal Medicine

## 2023-07-14 DIAGNOSIS — E782 Mixed hyperlipidemia: Secondary | ICD-10-CM

## 2023-08-28 ENCOUNTER — Ambulatory Visit: Payer: Medicare Other | Admitting: Dermatology

## 2023-09-15 ENCOUNTER — Ambulatory Visit: Payer: Medicare Other | Admitting: Dermatology

## 2023-09-15 ENCOUNTER — Encounter: Payer: Self-pay | Admitting: Dermatology

## 2023-09-15 DIAGNOSIS — B079 Viral wart, unspecified: Secondary | ICD-10-CM

## 2023-09-15 DIAGNOSIS — D492 Neoplasm of unspecified behavior of bone, soft tissue, and skin: Secondary | ICD-10-CM

## 2023-09-15 DIAGNOSIS — L820 Inflamed seborrheic keratosis: Secondary | ICD-10-CM

## 2023-09-15 NOTE — Progress Notes (Signed)
   Follow-Up Visit   Subjective  Derek Dawson is a 77 y.o. male who presents for the following: ISK  f/u, pt has new areas on nose and back The patient has spots, moles and lesions to be evaluated, some may be new or changing and the patient may have concern these could be cancer.   The following portions of the chart were reviewed this encounter and updated as appropriate: medications, allergies, medical history  Review of Systems:  No other skin or systemic complaints except as noted in HPI or Assessment and Plan.  Objective  Well appearing patient in no apparent distress; mood and affect are within normal limits.   A focused examination was performed of the following areas: Face, back  Relevant exam findings are noted in the Assessment and Plan.  L nose 9.19mm cerebriform keratotic pap  R lower posterior leg x 1, R lower back x 2 (3) Stuck on waxy paps with erythema  Assessment & Plan     NEOPLASM OF SKIN L nose Skin / nail biopsy Type of biopsy: tangential   Informed consent: discussed and consent obtained   Timeout: patient name, date of birth, surgical site, and procedure verified   Procedure prep:  Patient was prepped and draped in usual sterile fashion Prep type:  Isopropyl alcohol Anesthesia: the lesion was anesthetized in a standard fashion   Anesthetic:  1% lidocaine w/ epinephrine 1-100,000 buffered w/ 8.4% NaHCO3 Instrument used: DermaBlade   Hemostasis achieved with: pressure and aluminum chloride   Hemostasis achieved with comment:  Electrocautery Outcome: patient tolerated procedure well   Post-procedure details: sterile dressing applied and wound care instructions given   Dressing type: bandage and petrolatum   Specimen 1 - Surgical pathology Differential Diagnosis: ISK vs Verruca vs SCC  Check Margins: No 9.77mm cerebriform keratotic pap INFLAMED SEBORRHEIC KERATOSIS (3) R lower posterior leg x 1, R lower back x 2 (3) Symptomatic, irritating,  patient would like treated. Destruction of lesion - R lower posterior leg x 1, R lower back x 2 (3) Complexity: simple   Destruction method: cryotherapy   Informed consent: discussed and consent obtained   Timeout:  patient name, date of birth, surgical site, and procedure verified Lesion destroyed using liquid nitrogen: Yes   Region frozen until ice ball extended beyond lesion: Yes   Cryo cycles: 1 or 2. Outcome: patient tolerated procedure well with no complications   Post-procedure details: wound care instructions given    Return for as scheduled for 1 yr TBSE , Hx of Melanoma, Hx of Dysplastic nevi, Hx of AKs.  I, [name], RMA, am acting as scribe for Elie Goody, MD .   Documentation: I have reviewed the above documentation for accuracy and completeness, and I agree with the above.  Elie Goody, MD

## 2023-09-15 NOTE — Patient Instructions (Addendum)

## 2023-09-16 LAB — SURGICAL PATHOLOGY

## 2024-05-20 ENCOUNTER — Ambulatory Visit: Payer: Medicare Other | Admitting: Dermatology

## 2024-05-20 ENCOUNTER — Encounter: Payer: Self-pay | Admitting: Dermatology

## 2024-05-20 DIAGNOSIS — D1801 Hemangioma of skin and subcutaneous tissue: Secondary | ICD-10-CM

## 2024-05-20 DIAGNOSIS — L578 Other skin changes due to chronic exposure to nonionizing radiation: Secondary | ICD-10-CM | POA: Diagnosis not present

## 2024-05-20 DIAGNOSIS — Z1283 Encounter for screening for malignant neoplasm of skin: Secondary | ICD-10-CM

## 2024-05-20 DIAGNOSIS — W908XXA Exposure to other nonionizing radiation, initial encounter: Secondary | ICD-10-CM | POA: Diagnosis not present

## 2024-05-20 DIAGNOSIS — C44619 Basal cell carcinoma of skin of left upper limb, including shoulder: Secondary | ICD-10-CM | POA: Diagnosis not present

## 2024-05-20 DIAGNOSIS — D229 Melanocytic nevi, unspecified: Secondary | ICD-10-CM

## 2024-05-20 DIAGNOSIS — C4491 Basal cell carcinoma of skin, unspecified: Secondary | ICD-10-CM

## 2024-05-20 DIAGNOSIS — L814 Other melanin hyperpigmentation: Secondary | ICD-10-CM

## 2024-05-20 DIAGNOSIS — D045 Carcinoma in situ of skin of trunk: Secondary | ICD-10-CM | POA: Diagnosis not present

## 2024-05-20 DIAGNOSIS — L820 Inflamed seborrheic keratosis: Secondary | ICD-10-CM | POA: Diagnosis not present

## 2024-05-20 DIAGNOSIS — D489 Neoplasm of uncertain behavior, unspecified: Secondary | ICD-10-CM

## 2024-05-20 DIAGNOSIS — Z85820 Personal history of malignant melanoma of skin: Secondary | ICD-10-CM

## 2024-05-20 DIAGNOSIS — Z86018 Personal history of other benign neoplasm: Secondary | ICD-10-CM

## 2024-05-20 DIAGNOSIS — L821 Other seborrheic keratosis: Secondary | ICD-10-CM

## 2024-05-20 DIAGNOSIS — D039 Melanoma in situ, unspecified: Secondary | ICD-10-CM

## 2024-05-20 HISTORY — DX: Melanoma in situ, unspecified: D03.9

## 2024-05-20 HISTORY — DX: Basal cell carcinoma of skin, unspecified: C44.91

## 2024-05-20 NOTE — Patient Instructions (Addendum)
 Wound Care Instructions  Cleanse wound gently with soap and water once a day then pat dry with clean gauze. Apply a thin coat of Petrolatum (petroleum jelly, Vaseline) over the wound (unless you have an allergy to this). We recommend that you use a new, sterile tube of Vaseline. Do not pick or remove scabs. Do not remove the yellow or white healing tissue from the base of the wound.  Cover the wound with fresh, clean, nonstick gauze and secure with paper tape. You may use Band-Aids in place of gauze and tape if the wound is small enough, but would recommend trimming much of the tape off as there is often too much. Sometimes Band-Aids can irritate the skin.  You should call the office for your biopsy report after 1 week if you have not already been contacted.  If you experience any problems, such as abnormal amounts of bleeding, swelling, significant bruising, significant pain, or evidence of infection, please call the office immediately.  FOR ADULT SURGERY PATIENTS: If you need something for pain relief you may take 1 extra strength Tylenol  (acetaminophen ) AND 2 Ibuprofen  (200mg  each) together every 4 hours as needed for pain. (do not take these if you are allergic to them or if you have a reason you should not take them.) Typically, you may only need pain medication for 1 to 3 days.      Recommend daily broad spectrum sunscreen SPF 30+ to sun-exposed areas, reapply every 2 hours as needed. Call for new or changing lesions.  Staying in the shade or wearing long sleeves, sun glasses (UVA+UVB protection) and wide brim hats (4-inch brim around the entire circumference of the hat) are also recommended for sun protection.    Melanoma ABCDEs  Melanoma is the most dangerous type of skin cancer, and is the leading cause of death from skin disease.  You are more likely to develop melanoma if you: Have light-colored skin, light-colored eyes, or red or blond hair Spend a lot of time in the sun Tan  regularly, either outdoors or in a tanning bed Have had blistering sunburns, especially during childhood Have a close family member who has had a melanoma Have atypical moles or large birthmarks  Early detection of melanoma is key since treatment is typically straightforward and cure rates are extremely high if we catch it early.   The first sign of melanoma is often a change in a mole or a new dark spot.  The ABCDE system is a way of remembering the signs of melanoma.  A for asymmetry:  The two halves do not match. B for border:  The edges of the growth are irregular. C for color:  A mixture of colors are present instead of an even brown color. D for diameter:  Melanomas are usually (but not always) greater than 6mm - the size of a pencil eraser. E for evolution:  The spot keeps changing in size, shape, and color.  Please check your skin once per month between visits. You can use a small mirror in front and a large mirror behind you to keep an eye on the back side or your body.   If you see any new or changing lesions before your next follow-up, please call to schedule a visit.  Please continue daily skin protection including broad spectrum sunscreen SPF 30+ to sun-exposed areas, reapplying every 2 hours as needed when you're outdoors.    Due to recent changes in healthcare laws, you may see results of your  pathology and/or laboratory studies on MyChart before the doctors have had a chance to review them. We understand that in some cases there may be results that are confusing or concerning to you. Please understand that not all results are received at the same time and often the doctors may need to interpret multiple results in order to provide you with the best plan of care or course of treatment. Therefore, we ask that you please give us  2 business days to thoroughly review all your results before contacting the office for clarification. Should we see a critical lab result, you will be  contacted sooner.   If You Need Anything After Your Visit  If you have any questions or concerns for your doctor, please call our main line at [phone] and press option 4 to reach your doctor's medical assistant. If no one answers, please leave a voicemail as directed and we will return your call as soon as possible. Messages left after 4 pm will be answered the following business day.   You may also send us  a message via MyChart. We typically respond to MyChart messages within 1-2 business days.  For prescription refills, please ask your pharmacy to contact our office. Our fax number is [phone].  If you have an urgent issue when the clinic is closed that cannot wait until the next business day, you can page your doctor at the number below.    Please note that while we do our best to be available for urgent issues outside of office hours, we are not available 24/7.   If you have an urgent issue and are unable to reach us , you may choose to seek medical care at your doctor's office, retail clinic, urgent care center, or emergency room.  If you have a medical emergency, please immediately call 911 or go to the emergency department.  Pager Numbers  - Dr. Hester: [phone]  - Dr. Jackquline: [phone]  - Dr. [name]: [phone]   - Dr. Raymund: [phone]  In the event of inclement weather, please call our main line at [phone] for an update on the status of any delays or closures.  Dermatology Medication Tips: Please keep the boxes that topical medications come in in order to help keep track of the instructions about where and how to use these. Pharmacies typically print the medication instructions only on the boxes and not directly on the medication tubes.   If your medication is too expensive, please contact our office at [phone] option 4 or send us  a message through MyChart.   We are unable to tell what your co-pay for medications will be in advance as this  is different depending on your insurance coverage. However, we may be able to find a substitute medication at lower cost or fill out paperwork to get insurance to cover a needed medication.   If a prior authorization is required to get your medication covered by your insurance company, please allow us  1-2 business days to complete this process.  Drug prices often vary depending on where the prescription is filled and some pharmacies may offer cheaper prices.  The website www.goodrx.com contains coupons for medications through different pharmacies. The prices here do not account for what the cost may be with help from insurance (it may be cheaper with your insurance), but the website can give you the price if you did not use any insurance.  - You can print the associated coupon and take it with your prescription to the pharmacy.  -  You may also stop by our office during regular business hours and pick up a GoodRx coupon card.  - If you need your prescription sent electronically to a different pharmacy, notify our office through [facility] or by phone at [phone] option 4.     Si Usted Necesita Algo Despus de Su Visita  Tambin puede enviarnos un mensaje a travs de Clinical cytogeneticist. Por lo general respondemos a los mensajes de MyChart en el transcurso de 1 a 2 das hbiles.  Para renovar recetas, por favor pida a su farmacia que se ponga en contacto con nuestra oficina. Randi lakes de fax es [place] [phone].  Si tiene un asunto urgente cuando la clnica est cerrada y que no puede esperar hasta el siguiente da hbil, puede llamar/localizar a su doctor(a) al nmero que aparece a continuacin.   Por favor, tenga en cuenta que aunque hacemos todo lo posible para estar disponibles para asuntos urgentes fuera del horario de [place], no estamos disponibles las 24 horas del da, los 7 [address]  [address] de la [place].   Si tiene un problema urgente y no puede comunicarse con nosotros, puede optar por  buscar atencin mdica  en el consultorio de su doctor(a), en una clnica privada, en un centro de atencin urgente o en una sala de emergencias.  Si tiene Engineer, drilling, por favor llame inmediatamente al 911 o vaya a la sala de emergencias.  Nmeros de bper  - Dr. Hester: [phone]  - Dra. Jackquline: [phone]  - Dr. [name]: [phone]  - Dra. Kitts: [phone]  En caso de inclemencias del [place], por favor llame a nuestra lnea principal al [phone] para una actualizacin sobre el estado de cualquier retraso o cierre.  Consejos para la medicacin en dermatologa: Por favor, guarde las cajas en las que vienen los medicamentos de uso tpico para ayudarle a seguir las instrucciones sobre dnde y cmo usarlos. Las farmacias generalmente imprimen las instrucciones del medicamento slo en las cajas y no directamente en los tubos del [place].   Si su medicamento es muy caro, por favor, pngase en contacto con landry rieger llamando al [phone] y presione la opcin 4 o envenos un mensaje a travs de Clinical cytogeneticist.   No podemos decirle cul ser su copago por los medicamentos por adelantado ya que esto es diferente dependiendo de la cobertura de su seguro. Sin embargo, es posible que podamos encontrar un medicamento sustituto a Audiological scientist un formulario para que el seguro cubra el medicamento que se considera necesario.   Si se requiere una autorizacin previa para que su compaa de seguros malta su medicamento, por favor permtanos de 1 a 2 das hbiles para completar este proceso.  Los precios de los medicamentos varan con frecuencia dependiendo del Environmental consultant de dnde se surte la receta y alguna farmacias pueden ofrecer precios ms baratos.  El sitio web www.goodrx.com tiene cupones para medicamentos de Health and safety inspector. Los precios aqu no tienen en cuenta lo que podra costar con la ayuda del seguro (puede ser ms barato con su seguro), pero el sitio web  puede darle el precio si no utiliz Tourist information centre manager.  - Puede imprimir el cupn correspondiente y llevarlo con su receta a la farmacia.  - Tambin puede pasar por nuestra oficina durante el horario de atencin regular y Education officer, museum una tarjeta de cupones de GoodRx.  - Si necesita que su receta se enve electrnicamente a Psychiatrist, informe a nuestra oficina a travs de MyChart de [employer] o por  telfono llamando al [phone] y presione la opcin 4.

## 2024-05-20 NOTE — Progress Notes (Signed)
 Follow-Up Visit   Subjective  Derek Dawson is a 78 y.o. male who presents for the following: Skin Cancer Screening and Full Body Skin Exam; hx of Melanoma and dysplastic nevi. Patient reports areas of concern on the left thigh and left chest.   The patient presents for Total-Body Skin Exam (TBSE) for skin cancer screening and mole check. The patient has spots, moles and lesions to be evaluated, some may be new or changing and the patient may have concern these could be cancer.    The following portions of the chart were reviewed this encounter and updated as appropriate: medications, allergies, medical history  Review of Systems:  No other skin or systemic complaints except as noted in HPI or Assessment and Plan.  Objective  Well appearing patient in no apparent distress; mood and affect are within normal limits.  A full examination was performed including scalp, head, eyes, ears, nose, lips, neck, chest, axillae, abdomen, back, buttocks, bilateral upper extremities, bilateral lower extremities, hands, feet, fingers, toes, fingernails, and toenails. All findings within normal limits unless otherwise noted below.   Relevant physical exam findings are noted in the Assessment and Plan.  Left Thigh - Anterior 11 mm keratotic plaque  Left clavical 13 mm keratotic plaque  Left Upper Back 7mm multicolored brown macule   Left Shoulder - Posterior 5 mm pink papule telangiectasias   Assessment & Plan   SKIN CANCER SCREENING PERFORMED TODAY.  ACTINIC DAMAGE - Chronic condition, secondary to cumulative UV/sun exposure - diffuse scaly erythematous macules with underlying dyspigmentation - Recommend daily broad spectrum sunscreen SPF 30+ to sun-exposed areas, reapply every 2 hours as needed.  - Staying in the shade or wearing long sleeves, sun glasses (UVA+UVB protection) and wide brim hats (4-inch brim around the entire circumference of the hat) are also recommended for sun  protection.  - Call for new or changing lesions.  LENTIGINES, SEBORRHEIC KERATOSES, HEMANGIOMAS - Benign normal skin lesions - Benign-appearing - Call for any changes  MELANOCYTIC NEVI - Tan-brown and/or pink-flesh-colored symmetric macules and papules - Benign appearing on exam today - Observation - Call clinic for new or changing moles - Recommend daily use of broad spectrum spf 30+ sunscreen to sun-exposed areas.   History of Dysplastic Nevi - No evidence of recurrence today - Recommend regular full body skin exams - Recommend daily broad spectrum sunscreen SPF 30+ to sun-exposed areas, reapply every 2 hours as needed.  - Call if any new or changing lesions are noted between office visits   HISTORY OF MELANOMA - Left low back 4.0 cm lat to spine - 12/31/2017  - No evidence of recurrence today - No lymphadenopathy - Recommend regular full body skin exams - Recommend daily broad spectrum sunscreen SPF 30+ to sun-exposed areas, reapply every 2 hours as needed.  - Call if any new or changing lesions are noted between office visits  NEOPLASM OF UNCERTAIN BEHAVIOR (4) Left Thigh - Anterior Epidermal / dermal shaving  Lesion diameter (cm):  1.1 Informed consent: discussed and consent obtained   Timeout: patient name, date of birth, surgical site, and procedure verified   Patient was prepped and draped in usual sterile fashion: area prepped with alcohol. Anesthesia: the lesion was anesthetized in a standard fashion   Anesthetic:  1% lidocaine  w/ epinephrine  1-100,000 local infiltration Instrument used: DermaBlade and flexible razor blade   Hemostasis achieved with: pressure, aluminum chloride and electrodesiccation   Outcome: patient tolerated procedure well   Post-procedure details: wound  care instructions given   Post-procedure details comment:  Ointment and a small bandage applied  Specimen 1 - Surgical pathology Differential Diagnosis: SK vs SCC vs verruca  Check Margins:  Yes Left clavical Epidermal / dermal shaving  Lesion diameter (cm):  1.3 Informed consent: discussed and consent obtained   Timeout: patient name, date of birth, surgical site, and procedure verified   Patient was prepped and draped in usual sterile fashion: area prepped with alcohol. Anesthesia: the lesion was anesthetized in a standard fashion   Anesthetic:  1% lidocaine  w/ epinephrine  1-100,000 local infiltration Instrument used: DermaBlade and flexible razor blade   Hemostasis achieved with: pressure, aluminum chloride and electrodesiccation   Outcome: patient tolerated procedure well   Post-procedure details: wound care instructions given   Post-procedure details comment:  Ointment and a small bandage applied  Specimen 2 - Surgical pathology Differential Diagnosis: SK vs Verruca   Check Margins: No  Left Upper Back Epidermal / dermal shaving  Lesion diameter (cm):  0.7 Informed consent: discussed and consent obtained   Timeout: patient name, date of birth, surgical site, and procedure verified   Patient was prepped and draped in usual sterile fashion: area prepped with alcohol. Anesthesia: the lesion was anesthetized in a standard fashion   Anesthetic:  1% lidocaine  w/ epinephrine  1-100,000 local infiltration Instrument used: DermaBlade and flexible razor blade   Hemostasis achieved with: pressure, aluminum chloride and electrodesiccation   Outcome: patient tolerated procedure well   Post-procedure details: wound care instructions given   Post-procedure details comment:  Ointment and a small bandage applied  Specimen 3 - Surgical pathology Differential Diagnosis: SK vs dysplastic nevus vs melanoma   Check Margins: no  Left Shoulder - Posterior Skin / nail biopsy Type of biopsy: tangential   Informed consent: discussed and consent obtained   Timeout: patient name, date of birth, surgical site, and procedure verified   Patient was prepped and draped in usual sterile  fashion: Area prepped with alcohol. Anesthesia: the lesion was anesthetized in a standard fashion   Anesthetic:  1% lidocaine  w/ epinephrine  1-100,000 buffered w/ 8.4% NaHCO3 Instrument used: flexible razor blade   Hemostasis achieved with: pressure, aluminum chloride and electrodesiccation   Outcome: patient tolerated procedure well   Post-procedure details: wound care instructions given   Post-procedure details comment:  Ointment and small bandage applied  Specimen 4 - Surgical pathology Differential Diagnosis: BCC vs Scar  Check Margins: No MULTIPLE BENIGN NEVI   ACTINIC ELASTOSIS   SEBORRHEIC KERATOSES   LENTIGINES   CHERRY ANGIOMA   Return in about 1 year (around 05/20/2025) for TBSE.  I, Emerick Ege, CMA am acting as scribe for Boneta Sharps, MD.   Documentation: I have reviewed the above documentation for accuracy and completeness, and I agree with the above.  Boneta Sharps, MD

## 2024-05-25 ENCOUNTER — Ambulatory Visit: Payer: Self-pay | Admitting: Dermatology

## 2024-05-25 ENCOUNTER — Telehealth: Payer: Self-pay | Admitting: Dermatology

## 2024-05-25 LAB — SURGICAL PATHOLOGY

## 2024-05-25 NOTE — Telephone Encounter (Signed)
 Shared biopsy results. L upper back Mis needs WLE. Risk of spread and harm to health if untreated. L post shoulder BCC excision. Other two benign iSKs. Patient agrees to excision 9/17 9:45 for Mis.

## 2024-06-02 ENCOUNTER — Telehealth: Payer: Self-pay

## 2024-06-02 ENCOUNTER — Ambulatory Visit (INDEPENDENT_AMBULATORY_CARE_PROVIDER_SITE_OTHER): Admitting: Dermatology

## 2024-06-02 ENCOUNTER — Encounter: Payer: Self-pay | Admitting: Dermatology

## 2024-06-02 DIAGNOSIS — D0359 Melanoma in situ of other part of trunk: Secondary | ICD-10-CM | POA: Diagnosis not present

## 2024-06-02 DIAGNOSIS — L821 Other seborrheic keratosis: Secondary | ICD-10-CM

## 2024-06-02 MED ORDER — MUPIROCIN 2 % EX OINT: 1.0000 | TOPICAL_OINTMENT | Freq: Every day | CUTANEOUS | 0 refills | Status: DC

## 2024-06-02 NOTE — Telephone Encounter (Signed)
Patient doing well after today's surgery./sh 

## 2024-06-02 NOTE — Progress Notes (Signed)
 Follow-Up Visit   Subjective  Derek Dawson is a 78 y.o. 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  Objective  Well appearing patient in no apparent distress; mood and affect are within normal limits.   A focused examination was performed of the following areas: Back, left thigh  Relevant exam findings are noted in the Assessment and Plan.  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 Skin excision  Excision method:  elliptical Lesion length (cm):  0.7 Margin per side (cm):  0.5 Total excision diameter (cm):  1.7 Informed consent: discussed and consent obtained   Timeout: patient name, date of birth, surgical site, and procedure verified   Procedure prep:  Patient was prepped and draped in usual sterile fashion Prep type:  Chlorhexidine Anesthesia: the lesion was anesthetized in a standard fashion   Anesthetic:  1% lidocaine  w/ epinephrine  1-100,000 buffered w/ 8.4% NaHCO3 (15cc lido w/ epi) Instrument used: #15 blade   Hemostasis achieved with: suture, pressure and electrodesiccation   Outcome: patient tolerated procedure well with no complications    Skin repair Complexity:  Intermediate Final length (cm):  5.7 Informed consent: discussed and consent obtained   Timeout: patient name, date of birth, surgical site, and procedure verified    Procedure prep:  Patient was prepped and draped in usual sterile fashion Prep type:  Chlorhexidine Anesthesia: the lesion was anesthetized in a standard fashion   Anesthetic:  1% lidocaine  w/ epinephrine  1-100,000 buffered w/ 8.4% NaHCO3 Reason for type of repair: reduce tension to allow closure, reduce the risk of dehiscence, infection, and necrosis, reduce subcutaneous dead space and avoid a hematoma, allow closure of the large defect and preserve normal anatomy   Undermining: edges could be approximated without difficulty   Subcutaneous layers (deep stitches):  Suture size:  3-0 Suture type: Monocryl (poliglecaprone 25)   Stitches:  Buried vertical mattress Fine/surface layer approximation (top stitches):  Suture size:  4-0 Suture type: Prolene (polypropylene)   Stitches comment:  Running locked Hemostasis achieved with: suture, pressure and electrodesiccation Outcome: patient tolerated procedure well with no complications   Post-procedure details: sterile dressing applied and wound care instructions given   Dressing type: bandage and pressure dressing (mupirocin )    mupirocin  ointment (BACTROBAN ) 2 % Apply 1 Application topically daily. qd to excision site on back Specimen 1 - Surgical pathology Differential Diagnosis: Bx proven Melanoma IS  Check Margins: yes Pink bx site [phone] Lateral tag Bx proven Melanoma In Situ Start Mupirocin  oint qd to excision site  Return in about 1 week (around 06/09/2024) for suture removal, schedule TBSE in 3 months and schedule surgery of BCC- Left shoulder.  I, Grayce Saunas, RMA, am acting as scribe for Boneta Sharps, MD .   Documentation: I have reviewed the above documentation for accuracy and completeness, and I agree with the above.  [name]  [name], MD

## 2024-06-02 NOTE — Patient Instructions (Signed)
 Wound Care Instructions  On the day following your surgery, you should begin doing daily dressing changes: Remove the old dressing and discard it. Cleanse the wound gently with tap water. This may be done in the shower or by placing a wet gauze pad directly on the wound and letting it soak for several minutes. It is important to gently remove any dried blood from the wound in order to encourage healing. This may be done by gently rolling a moistened Q-tip on the dried blood. Do not pick at the wound. If the wound should start to bleed, continue cleaning the wound, then place a moist gauze pad on the wound and hold pressure for a few minutes.  Make sure you then dry the skin surrounding the wound completely or the tape will not stick to the skin. Do not use cotton balls on the wound. After the wound is clean and dry, apply the ointment gently with a Q-tip. Cut a non-stick pad to fit the size of the wound. Lay the pad flush to the wound. If the wound is draining, you may want to reinforce it with a small amount of gauze on top of the non-stick pad for a little added compression to the area. Use the tape to seal the area completely. Select from the following with respect to your individual situation: If your wound has been stitched closed: continue the above steps 1-8 at least daily until your sutures are removed. If your wound has been left open to heal: continue steps 1-8 at least daily for the first 3-4 weeks. We would like for you to take a few extra precautions for at least the next week. Sleep with your head elevated on pillows if our wound is on your head. Do not bend over or lift heavy items to reduce the chance of elevated blood pressure to the wound Do not participate in particularly strenuous activities.   Below is a list of dressing supplies you might need.  Cotton-tipped applicators - Q-tips Gauze pads (2x2 and/or 4x4) - All-Purpose Sponges Non-stick dressing material - Telfa Tape -  Paper or Hypafix New and clean tube of petroleum jelly - Vaseline    Comments on Post-Operative Period Slight swelling and redness often appear around the wound. This is normal and will disappear within several days following the surgery. The healing wound will drain a brownish-red-yellow discharge during healing. This is a normal phase of wound healing. As the wound begins to heal, the drainage may increase in amount. Again, this drainage is normal. Notify us if the drainage becomes persistently bloody, excessively swollen, or intensely painful or develops a foul odor or red streaks.  If you should experience mild discomfort during the healing phase, you may take an aspirin-free medication such as Tylenol (acetaminophen). Notify us if the discomfort is severe or persistent. Avoid alcoholic beverages when taking pain medicine.  In Case of Wound Hemorrhage A wound hemorrhage is when the bandage suddenly becomes soaked with bright red blood and flows profusely. If this happens, sit down or lie down with your head elevated. If the wound has a dressing on it, do not remove the dressing. Apply pressure to the existing gauze. If the wound is not covered, use a gauze pad to apply pressure and continue applying the pressure for 20 minutes without peeking. DO NOT COVER THE WOUND WITH A LARGE TOWEL OR WASH CLOTH. Release your hand from the wound site but do not remove the dressing. If the bleeding has stopped,  gently clean around the wound. Leave the dressing in place for 24 hours if possible. This wait time allows the blood vessels to close off so that you do not spark a new round of bleeding by disrupting the newly clotted blood vessels with an immediate dressing change. If the bleeding does not subside, continue to hold pressure. If matters are out of your control, contact an After Hours clinic or go to the Emergency Room.

## 2024-06-04 LAB — SURGICAL PATHOLOGY

## 2024-06-06 ENCOUNTER — Ambulatory Visit: Payer: Self-pay | Admitting: Dermatology

## 2024-06-10 ENCOUNTER — Encounter: Payer: Self-pay | Admitting: Dermatology

## 2024-06-10 ENCOUNTER — Ambulatory Visit (INDEPENDENT_AMBULATORY_CARE_PROVIDER_SITE_OTHER): Admitting: Dermatology

## 2024-06-10 DIAGNOSIS — Z4802 Encounter for removal of sutures: Secondary | ICD-10-CM

## 2024-06-10 DIAGNOSIS — Z5189 Encounter for other specified aftercare: Secondary | ICD-10-CM

## 2024-06-10 DIAGNOSIS — Z48817 Encounter for surgical aftercare following surgery on the skin and subcutaneous tissue: Secondary | ICD-10-CM

## 2024-06-10 NOTE — Progress Notes (Signed)
   Follow-Up Visit   Subjective  Derek Dawson is a 78 y.o. male who presents for the following: Suture removal at left upper back.  Pathology showed no residual MIS, margins free.  The following portions of the chart were reviewed this encounter and updated as appropriate: medications, allergies, medical history  Review of Systems:  No other skin or systemic complaints except as noted in HPI or Assessment and Plan.  Objective  Well appearing patient in no apparent distress; mood and affect are within normal limits.  Areas Examined: back Relevant physical exam findings are noted in the Assessment and Plan.    Assessment & Plan   VISIT FOR WOUND CHECK   ENCOUNTER FOR REMOVAL OF SUTURES   Encounter for Removal of Sutures - Incision site is clean, dry and intact. - Wound cleansed, sutures removed, wound cleansed and steri strips applied.  - Discussed pathology results showing no residual MIS, margins free. - Patient advised to keep steri-strips dry until they fall off. - Scars remodel for a full year. - Once steri-strips fall off, patient can apply over-the-counter silicone scar cream once to twice a day to help with scar remodeling if desired. - Patient advised to call with any concerns or if they notice any new or changing lesions.  No follow-ups on file.  I, Jill Parcell, CMA, am acting as scribe for Boneta Sharps, MD.   Documentation: I have reviewed the above documentation for accuracy and completeness, and I agree with the above.  Boneta Sharps, MD

## 2024-07-07 ENCOUNTER — Encounter: Payer: Self-pay | Admitting: Dermatology

## 2024-07-07 ENCOUNTER — Ambulatory Visit (INDEPENDENT_AMBULATORY_CARE_PROVIDER_SITE_OTHER): Admitting: Dermatology

## 2024-07-07 DIAGNOSIS — D0359 Melanoma in situ of other part of trunk: Secondary | ICD-10-CM

## 2024-07-07 DIAGNOSIS — C44619 Basal cell carcinoma of skin of left upper limb, including shoulder: Secondary | ICD-10-CM | POA: Diagnosis not present

## 2024-07-07 MED ORDER — MUPIROCIN 2 % EX OINT: 1.0000 | TOPICAL_OINTMENT | Freq: Every day | CUTANEOUS | 0 refills | Status: DC

## 2024-07-07 NOTE — Patient Instructions (Addendum)

## 2024-07-07 NOTE — Progress Notes (Signed)
   Follow-Up Visit   Subjective  Derek Dawson is a 78 y.o. male who presents for the following: Excision of BCC at left posterior shoulder  The following portions of the chart were reviewed this encounter and updated as appropriate: medications, allergies, medical history  Review of Systems:  No other skin or systemic complaints except as noted in HPI or Assessment and Plan.  Objective  Well appearing patient in no apparent distress; mood and affect are within normal limits.  A focused examination was performed of the following areas: Left shoulder Relevant physical exam findings are noted in the Assessment and Plan.   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 - Posterior Skin excision  Excision method:  elliptical Lesion length (cm):  0.8 Margin per side (cm):  0.4 Total excision diameter (cm):  1.6 Informed consent: discussed and consent obtained   Timeout: patient name, date of birth, surgical site, and procedure verified   Procedure prep:  Patient was prepped and draped in usual sterile fashion Prep type:  Chlorhexidine Anesthesia: the lesion was anesthetized in a standard fashion   Anesthetic:  1% lidocaine  w/ epinephrine  1-100,000 buffered w/ 8.4% NaHCO3 (12 cc) Instrument used: #15 blade   Hemostasis achieved with: suture, pressure and electrodesiccation   Outcome: patient tolerated procedure well with no complications    Skin repair Complexity:  Intermediate Final length (cm):  5.6 Informed consent: discussed and consent obtained   Timeout: patient name, date of birth, surgical site, and procedure verified   Procedure prep:  Patient was prepped and draped in usual sterile fashion Prep type:  Chlorhexidine Anesthesia: the lesion was anesthetized in a standard fashion   Anesthetic:  1% lidocaine  w/ epinephrine  1-100,000 buffered w/ 8.4%  NaHCO3 Reason for type of repair: reduce tension to allow closure, reduce the risk of dehiscence, infection, and necrosis, reduce subcutaneous dead space and avoid a hematoma, allow closure of the large defect and preserve normal anatomy   Undermining: edges could be approximated without difficulty   Subcutaneous layers (deep stitches):  Suture size:  3-0 Suture type: Vicryl (polyglactin 910)   Stitches:  Buried vertical mattress Fine/surface layer approximation (top stitches):  Suture size:  4-0 Suture type: Prolene (polypropylene)   Stitches comment:  Running locked Suture removal (days):  14 Hemostasis achieved with: suture, pressure and electrodesiccation Outcome: patient tolerated procedure well with no complications   Post-procedure details: sterile dressing applied and wound care instructions given   Dressing type: petrolatum, bandage and pressure dressing    Specimen 1 - Surgical pathology Differential Diagnosis: BX proven BCC  Check Margins: yes [id number] 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 qd to excision site on back   Return in about 2 weeks (around 07/21/2024) for Suture Removal.  LILLETTE Lonell Drones, RMA, am acting as scribe for Boneta Sharps, MD .   Documentation: I have reviewed the above documentation for accuracy and completeness, and I agree with the above.  Boneta Sharps, MD

## 2024-07-08 ENCOUNTER — Telehealth: Payer: Self-pay

## 2024-07-08 NOTE — Telephone Encounter (Signed)
 Patient doing fine after yesterday's surgery. No questions or concerns.  Lonell RAMAN., RMA

## 2024-07-12 LAB — SURGICAL PATHOLOGY

## 2024-07-14 ENCOUNTER — Ambulatory Visit: Payer: Self-pay | Admitting: Dermatology

## 2024-07-14 NOTE — Telephone Encounter (Signed)
-----   Message from [place] sent at 07/14/2024  2:24 PM EDT ----- Please call: L shoulder, no residual BCC, please get update on wound ----- Message ----- From: Interface, [facility] Sent: 07/12/2024   5:18 PM EDT To: Boneta Sharps, MD

## 2024-07-14 NOTE — Telephone Encounter (Signed)
 Spoke with patient and advised of results. Patient stated his wound was doing pretty good.

## 2024-07-19 ENCOUNTER — Encounter
Admission: RE | Disposition: A | Discharge status: Home / Self Care | Payer: Self-pay | Source: Home / Self Care | Attending: Internal Medicine

## 2024-07-19 ENCOUNTER — Encounter: Payer: Self-pay | Admitting: Internal Medicine

## 2024-07-19 ENCOUNTER — Other Ambulatory Visit: Payer: Self-pay

## 2024-07-19 ENCOUNTER — Ambulatory Visit
Admission: RE | Admit: 2024-07-19 | Discharge: 2024-07-19 | Disposition: A | Discharge status: Home / Self Care | Attending: Internal Medicine | Admitting: Internal Medicine

## 2024-07-19 DIAGNOSIS — R9439 Abnormal result of other cardiovascular function study: Secondary | ICD-10-CM | POA: Diagnosis present

## 2024-07-19 DIAGNOSIS — R0602 Shortness of breath: Secondary | ICD-10-CM | POA: Insufficient documentation

## 2024-07-19 DIAGNOSIS — K219 Gastro-esophageal reflux disease without esophagitis: Secondary | ICD-10-CM | POA: Insufficient documentation

## 2024-07-19 DIAGNOSIS — E782 Mixed hyperlipidemia: Secondary | ICD-10-CM | POA: Diagnosis not present

## 2024-07-19 DIAGNOSIS — Z79899 Other long term (current) drug therapy: Secondary | ICD-10-CM | POA: Insufficient documentation

## 2024-07-19 DIAGNOSIS — R079 Chest pain, unspecified: Secondary | ICD-10-CM | POA: Diagnosis not present

## 2024-07-19 DIAGNOSIS — I447 Left bundle-branch block, unspecified: Secondary | ICD-10-CM | POA: Diagnosis not present

## 2024-07-19 DIAGNOSIS — I493 Ventricular premature depolarization: Secondary | ICD-10-CM | POA: Diagnosis not present

## 2024-07-19 DIAGNOSIS — I10 Essential (primary) hypertension: Secondary | ICD-10-CM | POA: Diagnosis not present

## 2024-07-19 DIAGNOSIS — E119 Type 2 diabetes mellitus without complications: Secondary | ICD-10-CM | POA: Insufficient documentation

## 2024-07-19 HISTORY — PX: LEFT HEART CATH AND CORONARY ANGIOGRAPHY: CATH118249

## 2024-07-19 LAB — CARDIAC CATHETERIZATION: Cath EF Quantitative: 60 %

## 2024-07-19 LAB — GLUCOSE, CAPILLARY: Glucose-Capillary: 125 mg/dL — ABNORMAL HIGH (ref 70–99)

## 2024-07-19 SURGERY — LEFT HEART CATH AND CORONARY ANGIOGRAPHY
Anesthesia: Moderate Sedation | Laterality: Left

## 2024-07-19 MED ORDER — LIDOCAINE HCL 1 % IJ SOLN
INTRAMUSCULAR | Status: AC
  Filled 2024-07-19: qty 20

## 2024-07-19 MED ORDER — FREE WATER
500.0000 mL | Freq: Once | Status: AC
  Administered 2024-07-19: 500 mL via ORAL

## 2024-07-19 MED ORDER — ASPIRIN 81 MG PO CHEW
CHEWABLE_TABLET | ORAL | Status: AC
  Filled 2024-07-19: qty 1

## 2024-07-19 MED ORDER — FENTANYL CITRATE (PF) 100 MCG/2ML IJ SOLN
INTRAMUSCULAR | Status: DC | PRN
  Administered 2024-07-19: 25 ug via INTRAVENOUS

## 2024-07-19 MED ORDER — LIDOCAINE HCL (PF) 1 % IJ SOLN
INTRAMUSCULAR | Status: DC | PRN
  Administered 2024-07-19: 2 mL

## 2024-07-19 MED ORDER — VERAPAMIL HCL 2.5 MG/ML IV SOLN
INTRAVENOUS | Status: AC
  Filled 2024-07-19: qty 2

## 2024-07-19 MED ORDER — SODIUM CHLORIDE 0.9 % IV SOLN
250.0000 mL | INTRAVENOUS | Status: DC | PRN
  Administered 2024-07-19: 250 mL via INTRAVENOUS

## 2024-07-19 MED ORDER — HEPARIN SODIUM (PORCINE) 1000 UNIT/ML IJ SOLN
INTRAMUSCULAR | Status: AC
  Filled 2024-07-19: qty 10

## 2024-07-19 MED ORDER — IOHEXOL 300 MG/ML  SOLN
INTRAMUSCULAR | Status: DC | PRN
Start: 2024-07-19 — End: 2024-07-19
  Administered 2024-07-19: 62 mL

## 2024-07-19 MED ORDER — SODIUM CHLORIDE 0.9% FLUSH: 3.0000 mL | INTRAVENOUS | Status: DC | PRN

## 2024-07-19 MED ORDER — SODIUM CHLORIDE 0.9% FLUSH: 3.0000 mL | Freq: Two times a day (BID) | INTRAVENOUS | Status: DC

## 2024-07-19 MED ORDER — FENTANYL CITRATE (PF) 100 MCG/2ML IJ SOLN
INTRAMUSCULAR | Status: AC
  Filled 2024-07-19: qty 2

## 2024-07-19 MED ORDER — HEPARIN (PORCINE) IN NACL 1000-0.9 UT/500ML-% IV SOLN
INTRAVENOUS | Status: AC
  Filled 2024-07-19: qty 1000

## 2024-07-19 MED ORDER — VERAPAMIL HCL 2.5 MG/ML IV SOLN
INTRAVENOUS | Status: DC | PRN
  Administered 2024-07-19: 2.5 mg via INTRACORONARY

## 2024-07-19 MED ORDER — MIDAZOLAM HCL 2 MG/2ML IJ SOLN
INTRAMUSCULAR | Status: AC
  Filled 2024-07-19: qty 2

## 2024-07-19 MED ORDER — MIDAZOLAM HCL (PF) 2 MG/2ML IJ SOLN
INTRAMUSCULAR | Status: DC | PRN
  Administered 2024-07-19: 1 mg via INTRAVENOUS

## 2024-07-19 MED ORDER — ASPIRIN 81 MG PO CHEW
81.0000 mg | CHEWABLE_TABLET | ORAL | Status: AC
  Administered 2024-07-19: 81 mg via ORAL

## 2024-07-19 MED ORDER — HEPARIN SODIUM (PORCINE) 1000 UNIT/ML IJ SOLN
INTRAMUSCULAR | Status: DC | PRN
Start: 2024-07-19 — End: 2024-07-19
  Administered 2024-07-19: 4000 [IU] via INTRAVENOUS

## 2024-07-19 MED ORDER — HEPARIN (PORCINE) IN NACL 1000-0.9 UT/500ML-% IV SOLN
INTRAVENOUS | Status: DC | PRN
Start: 2024-07-19 — End: 2024-07-19
  Administered 2024-07-19: 1000 mL

## 2024-07-19 SURGICAL SUPPLY — 8 items
CATH 5FR JL3.5 JR4 ANG PIG MP (CATHETERS) IMPLANT
DEVICE RAD TR BAND REGULAR (VASCULAR PRODUCTS) IMPLANT
DRAPE BRACHIAL (DRAPES) IMPLANT
GLIDESHEATH SLEND SS 6F .021 (SHEATH) IMPLANT
GUIDEWIRE INQWIRE 1.5J.035X260 (WIRE) IMPLANT
PACK CARDIAC CATH (CUSTOM PROCEDURE TRAY) ×1 IMPLANT
SET ATX-X65L (MISCELLANEOUS) IMPLANT
STATION PROTECTION PRESSURIZED (MISCELLANEOUS) IMPLANT

## 2024-07-20 ENCOUNTER — Encounter: Payer: Self-pay | Admitting: Internal Medicine

## 2024-07-20 ENCOUNTER — Ambulatory Visit

## 2024-07-20 DIAGNOSIS — C44619 Basal cell carcinoma of skin of left upper limb, including shoulder: Secondary | ICD-10-CM

## 2024-07-20 NOTE — Progress Notes (Signed)
   Follow-Up Visit   Subjective  Derek Dawson is a 78 y.o. male who presents for the following: Suture removal.  Pathology showed NO RESIDUAL BASAL CELL CARCINOMA, MARGINS FREE.   The following portions of the chart were reviewed this encounter and updated as appropriate: medications, allergies, medical history  Review of Systems:  No other skin or systemic complaints except as noted in HPI or Assessment and Plan.  Objective  Well appearing patient in no apparent distress; mood and affect are within normal limits.  Areas Examined: Left shoulder Relevant physical exam findings are noted in the Assessment and Plan.       Assessment & Plan    Encounter for Removal of Sutures - Incision site is clean, dry and intact. - Wound cleansed, sutures removed, wound cleansed and steri strips applied.  - Discussed pathology results showing NO RESIDUAL BASAL CELL CARCINOMA, MARGINS FREE  -Patient can apply over-the-counter silicone scar cream once to twice a day to help with scar remodeling if desired. - Patient advised to call with any concerns or if they notice any new or changing lesions.  Return for As scheduled, w/ Dr. [name], TBSE.  Nikolette Reindl V Fatina Sprankle, CMA

## 2024-07-20 NOTE — Patient Instructions (Signed)

## 2024-08-26 ENCOUNTER — Ambulatory Visit: Admitting: Dermatology

## 2024-08-26 ENCOUNTER — Encounter: Payer: Self-pay | Admitting: Dermatology

## 2024-08-26 DIAGNOSIS — L821 Other seborrheic keratosis: Secondary | ICD-10-CM

## 2024-08-26 DIAGNOSIS — L209 Atopic dermatitis, unspecified: Secondary | ICD-10-CM | POA: Diagnosis not present

## 2024-08-26 DIAGNOSIS — Z1283 Encounter for screening for malignant neoplasm of skin: Secondary | ICD-10-CM

## 2024-08-26 DIAGNOSIS — W908XXA Exposure to other nonionizing radiation, initial encounter: Secondary | ICD-10-CM

## 2024-08-26 DIAGNOSIS — Z85828 Personal history of other malignant neoplasm of skin: Secondary | ICD-10-CM | POA: Diagnosis not present

## 2024-08-26 DIAGNOSIS — D229 Melanocytic nevi, unspecified: Secondary | ICD-10-CM

## 2024-08-26 DIAGNOSIS — L814 Other melanin hyperpigmentation: Secondary | ICD-10-CM | POA: Diagnosis not present

## 2024-08-26 DIAGNOSIS — L578 Other skin changes due to chronic exposure to nonionizing radiation: Secondary | ICD-10-CM | POA: Diagnosis not present

## 2024-08-26 DIAGNOSIS — Z86006 Personal history of melanoma in-situ: Secondary | ICD-10-CM | POA: Diagnosis not present

## 2024-08-26 DIAGNOSIS — Z872 Personal history of diseases of the skin and subcutaneous tissue: Secondary | ICD-10-CM | POA: Diagnosis not present

## 2024-08-26 DIAGNOSIS — D225 Melanocytic nevi of trunk: Secondary | ICD-10-CM | POA: Diagnosis not present

## 2024-08-26 DIAGNOSIS — Z86018 Personal history of other benign neoplasm: Secondary | ICD-10-CM | POA: Diagnosis not present

## 2024-08-26 DIAGNOSIS — D492 Neoplasm of unspecified behavior of bone, soft tissue, and skin: Secondary | ICD-10-CM | POA: Diagnosis not present

## 2024-08-26 DIAGNOSIS — D1801 Hemangioma of skin and subcutaneous tissue: Secondary | ICD-10-CM

## 2024-08-26 NOTE — Patient Instructions (Addendum)

## 2024-08-26 NOTE — Progress Notes (Signed)
 Follow-Up Visit   Subjective  Derek Dawson is a 78 y.o. male who presents for the following: Skin Cancer Screening and Full Body Skin Exam, hx of Melanoma IS, BCC, Dysplastic Nevi, Aks, reports no areas of concern at this time.  The patient presents for Total-Body Skin Exam (TBSE) for skin cancer screening and mole check. The patient has spots, moles and lesions to be evaluated, some may be new or changing and the patient may have concern these could be cancer.    The following portions of the chart were reviewed this encounter and updated as appropriate: medications, allergies, medical history  Review of Systems:  No other skin or systemic complaints except as noted in HPI or Assessment and Plan.  Objective  Well appearing patient in no apparent distress; mood and affect are within normal limits.  A full examination was performed including scalp, head, eyes, ears, nose, lips, neck, chest, axillae, abdomen, back, buttocks, bilateral upper extremities, bilateral lower extremities, hands, feet, fingers, toes, fingernails, and toenails. All findings within normal limits unless otherwise noted below.   Relevant physical exam findings are noted in the Assessment and Plan.  Right Flank 1.7 x 0.5cm dark brown patch   Assessment & Plan   SKIN CANCER SCREENING PERFORMED TODAY.  ACTINIC DAMAGE - Chronic condition, secondary to cumulative UV/sun exposure - diffuse scaly erythematous macules with underlying dyspigmentation - Recommend daily broad spectrum sunscreen SPF 30+ to sun-exposed areas, reapply every 2 hours as needed.  - Staying in the shade or wearing long sleeves, sun glasses (UVA+UVB protection) and wide brim hats (4-inch brim around the entire circumference of the hat) are also recommended for sun protection.  - Call for new or changing lesions.  LENTIGINES, SEBORRHEIC KERATOSES, HEMANGIOMAS - Benign normal skin lesions - Benign-appearing - Call for any  changes  MELANOCYTIC NEVI - Tan-brown and/or pink-flesh-colored symmetric macules and papules - Benign appearing on exam today - Observation - Call clinic for new or changing moles - Recommend daily use of broad spectrum spf 30+ sunscreen to sun-exposed areas.   HISTORY OF MELANOMA IN SITU - No evidence of recurrence today - Recommend regular full body skin exams - Recommend daily broad spectrum sunscreen SPF 30+ to sun-exposed areas, reapply every 2 hours as needed.  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  Call if any new or changing lesions are noted between office visits  - L sacral paraspinal, R mid lat pretibial, L abdomen above the waistline  HISTORY OF PRECANCEROUS ACTINIC KERATOSIS - site(s) of PreCancerous Actinic Keratosis clear today. 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 Condition is bothersome/symptomatic for patient. Currently flared.   Atopic dermatitis (eczema) is a chronic, relapsing, pruritic condition that can significantly affect quality of life. It is often associated with allergic rhinitis and/or asthma and can require treatment with topical medications, phototherapy, or in severe cases biologic injectable medication (Dupixent; Adbry) or Oral JAK inhibitors.  Treatment Plan: Discussed topical treatment, pt declines Recommend mild soap and moisturizer qd NEOPLASM OF SKIN Right Flank - Skin / nail biopsy Type of biopsy: tangential   Informed consent: discussed and consent obtained   Timeout: patient name, date of birth, surgical site, and procedure verified   Procedure prep:  Patient was prepped and draped in usual sterile fashion Prep type:  Isopropyl alcohol Anesthesia: the lesion was anesthetized in a standard fashion   Anesthetic:  1% lidocaine  w/ epinephrine  1-100,000 buffered w/ 8.4% NaHCO3 Instrument used: DermaBlade   Hemostasis achieved with: pressure and aluminum chloride   Outcome: patient tolerated procedure well   Post-procedure details: sterile dressing applied and wound care instructions given   Dressing type: bandage (mupirocin )    Specimen 1 - Surgical pathology Differential Diagnosis: Nevus vs Dysplastic Nevus r/o Melanoma   Check Margins: No 1.7 x 0.5cm dark brown patch ACTINIC ELASTOSIS   SEBORRHEIC KERATOSES   MULTIPLE BENIGN NEVI   LENTIGINES   CHERRY ANGIOMA   ATOPIC DERMATITIS, UNSPECIFIED TYPE   Return in about 3 months (around 11/24/2024) for TBSE, Hx of Melanoma IS, Hx of BCC, Hx of Dysplastic nevi, Hx of AKs.  I, Grayce Saunas, RMA, am acting as scribe for Boneta Sharps, MD .   Documentation: I have reviewed the above documentation  for accuracy and completeness, and I agree with the above.  Boneta Sharps, MD

## 2024-08-30 ENCOUNTER — Ambulatory Visit: Payer: Self-pay | Admitting: Dermatology

## 2024-08-30 ENCOUNTER — Encounter: Payer: Self-pay | Admitting: Dermatology

## 2024-08-30 LAB — SURGICAL PATHOLOGY

## 2024-08-30 NOTE — Telephone Encounter (Signed)
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   Explanation: Your biopsy shows an atypical mole. It partially looks unusual enough that it cannot be distinguished from an early melanoma skin cancer. The safest course of action is to ensure it is  fully removed with a surgery.  Treatment: excision

## 2024-08-30 NOTE — Telephone Encounter (Signed)
 Spoke with patient ad discussed biopsy results. Patient voiced understanding to all and excision scheduled with Dr. [name] on 10/20/2024.

## 2024-09-22 ENCOUNTER — Encounter: Payer: Self-pay | Admitting: Dermatology

## 2024-09-22 ENCOUNTER — Ambulatory Visit: Admitting: Dermatology

## 2024-09-22 DIAGNOSIS — D239 Other benign neoplasm of skin, unspecified: Secondary | ICD-10-CM

## 2024-09-22 DIAGNOSIS — D225 Melanocytic nevi of trunk: Secondary | ICD-10-CM | POA: Diagnosis not present

## 2024-09-22 MED ORDER — MUPIROCIN 2 % EX OINT: 1.0000 | TOPICAL_OINTMENT | Freq: Every day | CUTANEOUS | 0 refills | Status: DC

## 2024-09-22 MED ORDER — MUPIROCIN 2 % EX OINT: 1.0000 | TOPICAL_OINTMENT | Freq: Every day | CUTANEOUS | 1 refills | Status: AC

## 2024-09-22 NOTE — Patient Instructions (Signed)

## 2024-09-22 NOTE — Progress Notes (Signed)
" ° °  Follow-Up Visit   Subjective  Derek Dawson is a 79 y.o. male who presents for the following: Excision of dysplastic nevus at right flank  The following portions of the chart were reviewed this encounter and updated as appropriate: medications, allergies, medical history  Review of Systems:  No other skin or systemic complaints except as noted in HPI or Assessment and Plan.  Objective  Well appearing patient in no apparent distress; mood and affect are within normal limits.  A focused examination was performed of the following areas: flank Relevant physical exam findings are noted in the Assessment and Plan.   Right Flank Pink bx site  Assessment & Plan   DYSPLASTIC NEVUS Right Flank - Skin excision - Right Flank  Excision method:  elliptical Lesion length (cm):  0.5 Lesion width (cm):  1.7 Margin per side (cm):  0.4 Total excision diameter (cm):  1.3 Informed consent: discussed and consent obtained   Timeout: patient name, date of birth, surgical site, and procedure verified   Procedure prep:  Patient was prepped and draped in usual sterile fashion Prep type:  Chlorhexidine Anesthesia: the lesion was anesthetized in a standard fashion   Anesthetic:  1% lidocaine  w/ epinephrine  1-100,000 buffered w/ 8.4% NaHCO3 (15 cc) Instrument used: #15 blade   Hemostasis achieved with: suture, pressure and electrodesiccation   Outcome: patient tolerated procedure well with no complications    - Skin repair - Right Flank Complexity:  Intermediate Final length (cm):  5 Informed consent: discussed and consent obtained   Timeout: patient name, date of birth, surgical site, and procedure verified   Procedure prep:  Patient was prepped and draped in usual sterile fashion Prep type:  Chlorhexidine Anesthesia: the lesion was anesthetized in a standard fashion   Anesthetic:  1% lidocaine  w/ epinephrine  1-100,000 buffered w/ 8.4% NaHCO3 Reason for type of repair: reduce tension to  allow closure, reduce the risk of dehiscence, infection, and necrosis, reduce subcutaneous dead space and avoid a hematoma, allow closure of the large defect and preserve normal anatomy   Undermining: edges could be approximated without difficulty   Subcutaneous layers (deep stitches):  Suture size:  3-0 Suture type: Vicryl (polyglactin 910)   Stitches:  Buried vertical mattress Fine/surface layer approximation (top stitches):  Suture size:  4-0 Suture type: Prolene (polypropylene)   Stitches comment:  Running locked Suture removal (days):  13 Hemostasis achieved with: suture, pressure and electrodesiccation Outcome: patient tolerated procedure well with no complications   Post-procedure details: sterile dressing applied and wound care instructions given   Dressing type: petrolatum, bandage and pressure dressing    Specimen 1 - Surgical pathology Differential Diagnosis: BX proven 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  LILLETTE Lonell Drones, RMA, am acting as scribe for Boneta Sharps, MD .   Documentation: I have reviewed the above documentation for accuracy and completeness, and I agree with the above.  Boneta Sharps, MD  "

## 2024-09-23 LAB — SURGICAL PATHOLOGY

## 2024-09-26 ENCOUNTER — Ambulatory Visit: Payer: Self-pay | Admitting: Dermatology

## 2024-09-27 NOTE — Telephone Encounter (Signed)
 Spoke with patient and advised of results. Patient voiced understanding to all and states his surgical wound is doing very well.

## 2024-09-27 NOTE — Telephone Encounter (Signed)
-----   Message from Boneta Sharps, MD sent at 09/26/2024  5:03 PM EST ----- Diagnosis right flank :       RESIDUAL DYSPLASTIC NEVUS, MARGIN FREE   Please call to share that excision was clear of abnormal mole and get update on surgical wound. Thank you.

## 2024-10-07 ENCOUNTER — Encounter: Payer: Self-pay | Admitting: Dermatology

## 2024-10-07 ENCOUNTER — Ambulatory Visit: Admitting: Dermatology

## 2024-10-07 DIAGNOSIS — Z4802 Encounter for removal of sutures: Secondary | ICD-10-CM

## 2024-10-07 DIAGNOSIS — Z5189 Encounter for other specified aftercare: Secondary | ICD-10-CM

## 2024-10-07 DIAGNOSIS — Z48817 Encounter for surgical aftercare following surgery on the skin and subcutaneous tissue: Secondary | ICD-10-CM

## 2024-10-07 NOTE — Patient Instructions (Signed)

## 2024-10-07 NOTE — Progress Notes (Signed)
" ° °  Follow-Up Visit   Subjective  Derek Dawson is a 79 y.o. male who presents for the following: Suture removal at right flank  Pathology showed residual dysplastic nevus, margins free.  The following portions of the chart were reviewed this encounter and updated as appropriate: medications, allergies, medical history  Review of Systems:  No other skin or systemic complaints except as noted in HPI or Assessment and Plan.  Objective  Well appearing patient in no apparent distress; mood and affect are within normal limits.  Areas Examined: Right flank   Relevant physical exam findings are noted in the Assessment and Plan.         Assessment & Plan   VISIT FOR WOUND CHECK   ENCOUNTER FOR REMOVAL OF SUTURES   Encounter for Removal of Sutures - Incision site is clean, dry and intact. - Wound cleansed, sutures removed, wound cleansed and bandage applied.  - Discussed pathology results showing residual dysplastic nevus, margins free. - Scars remodel for a full year. - Can apply over-the-counter silicone scar cream once to twice a day to help with scar remodeling if desired. - Patient advised to call with any concerns or if they notice any new or changing lesions.  Return for TBSE As Scheduled.  I, Jill Parcell, CMA, am acting as scribe for Boneta Sharps, MD.   Documentation: I have reviewed the above documentation for accuracy and completeness, and I agree with the above.  Boneta Sharps, MD  "

## 2024-10-20 ENCOUNTER — Encounter: Admitting: Dermatology

## 2024-11-25 ENCOUNTER — Ambulatory Visit: Admitting: Dermatology

## 2025-05-26 ENCOUNTER — Ambulatory Visit: Admitting: Dermatology
